# Patient Record
Sex: Female | Born: 1975 | Race: White | Hispanic: No | Marital: Married | Smoking: Never smoker
Health system: Southern US, Community
[De-identification: ages and names within clinical notes are randomized; demographics above are authoritative.]

## PROBLEM LIST (undated history)

## (undated) DIAGNOSIS — E039 Hypothyroidism, unspecified: Secondary | ICD-10-CM

## (undated) DIAGNOSIS — K08409 Partial loss of teeth, unspecified cause, unspecified class: Secondary | ICD-10-CM

## (undated) DIAGNOSIS — F419 Anxiety disorder, unspecified: Secondary | ICD-10-CM

## (undated) DIAGNOSIS — R002 Palpitations: Secondary | ICD-10-CM

## (undated) DIAGNOSIS — D72819 Decreased white blood cell count, unspecified: Secondary | ICD-10-CM

## (undated) DIAGNOSIS — D649 Anemia, unspecified: Secondary | ICD-10-CM

## (undated) DIAGNOSIS — D696 Thrombocytopenia, unspecified: Secondary | ICD-10-CM

## (undated) HISTORY — PX: WISDOM TOOTH EXTRACTION: SHX21

## (undated) HISTORY — DX: Palpitations: R00.2

## (undated) HISTORY — DX: Anxiety disorder, unspecified: F41.9

## (undated) HISTORY — DX: Thrombocytopenia, unspecified: D69.6

## (undated) HISTORY — DX: Decreased white blood cell count, unspecified: D72.819

---

## 2000-03-24 ENCOUNTER — Other Ambulatory Visit: Admission: RE | Admit: 2000-03-24 | Discharge: 2000-03-24 | Payer: Self-pay | Admitting: Obstetrics and Gynecology

## 2000-04-15 ENCOUNTER — Encounter: Payer: Self-pay | Admitting: Obstetrics and Gynecology

## 2000-04-15 ENCOUNTER — Inpatient Hospital Stay (HOSPITAL_COMMUNITY): Admission: AD | Admit: 2000-04-15 | Discharge: 2000-04-15 | Payer: Self-pay | Admitting: Obstetrics and Gynecology

## 2000-08-04 ENCOUNTER — Ambulatory Visit (HOSPITAL_COMMUNITY): Admission: AD | Admit: 2000-08-04 | Discharge: 2000-08-04 | Payer: Self-pay | Admitting: Family Medicine

## 2000-10-24 ENCOUNTER — Inpatient Hospital Stay (HOSPITAL_COMMUNITY): Admission: AD | Admit: 2000-10-24 | Discharge: 2000-10-26 | Payer: Self-pay | Admitting: Obstetrics and Gynecology

## 2000-11-05 ENCOUNTER — Encounter (HOSPITAL_COMMUNITY): Admission: RE | Admit: 2000-11-05 | Discharge: 2000-12-05 | Payer: Self-pay | Admitting: Obstetrics and Gynecology

## 2000-12-09 ENCOUNTER — Other Ambulatory Visit: Admission: RE | Admit: 2000-12-09 | Discharge: 2000-12-09 | Payer: Self-pay | Admitting: Obstetrics and Gynecology

## 2002-01-03 ENCOUNTER — Other Ambulatory Visit: Admission: RE | Admit: 2002-01-03 | Discharge: 2002-01-03 | Payer: Self-pay | Admitting: Obstetrics and Gynecology

## 2003-03-08 ENCOUNTER — Other Ambulatory Visit: Admission: RE | Admit: 2003-03-08 | Discharge: 2003-03-08 | Payer: Self-pay | Admitting: Obstetrics and Gynecology

## 2003-07-25 ENCOUNTER — Encounter: Admission: RE | Admit: 2003-07-25 | Discharge: 2003-07-25 | Payer: Self-pay | Admitting: Internal Medicine

## 2004-03-12 ENCOUNTER — Other Ambulatory Visit: Admission: RE | Admit: 2004-03-12 | Discharge: 2004-03-12 | Payer: Self-pay | Admitting: Obstetrics and Gynecology

## 2005-08-03 ENCOUNTER — Other Ambulatory Visit: Admission: RE | Admit: 2005-08-03 | Discharge: 2005-08-03 | Payer: Self-pay | Admitting: Obstetrics and Gynecology

## 2007-05-17 ENCOUNTER — Ambulatory Visit (HOSPITAL_COMMUNITY): Admission: RE | Admit: 2007-05-17 | Discharge: 2007-05-17 | Payer: Self-pay | Admitting: Obstetrics and Gynecology

## 2007-08-16 ENCOUNTER — Inpatient Hospital Stay (HOSPITAL_COMMUNITY): Admission: AD | Admit: 2007-08-16 | Discharge: 2007-08-18 | Payer: Self-pay | Admitting: Obstetrics and Gynecology

## 2007-09-02 ENCOUNTER — Ambulatory Visit: Admission: RE | Admit: 2007-09-02 | Discharge: 2007-09-02 | Payer: Self-pay | Admitting: Obstetrics and Gynecology

## 2009-12-05 DIAGNOSIS — F411 Generalized anxiety disorder: Secondary | ICD-10-CM | POA: Insufficient documentation

## 2010-01-09 ENCOUNTER — Ambulatory Visit: Payer: Self-pay | Admitting: Cardiology

## 2010-01-16 ENCOUNTER — Encounter: Admission: RE | Admit: 2010-01-16 | Discharge: 2010-01-16 | Payer: Self-pay | Admitting: Family Medicine

## 2010-01-20 ENCOUNTER — Ambulatory Visit (HOSPITAL_COMMUNITY): Admission: RE | Admit: 2010-01-20 | Discharge: 2010-01-20 | Payer: Self-pay | Admitting: Cardiology

## 2010-01-20 ENCOUNTER — Ambulatory Visit: Payer: Self-pay

## 2010-01-20 ENCOUNTER — Encounter: Payer: Self-pay | Admitting: Cardiology

## 2010-01-20 ENCOUNTER — Ambulatory Visit: Payer: Self-pay | Admitting: Internal Medicine

## 2010-03-12 ENCOUNTER — Ambulatory Visit: Payer: Self-pay | Admitting: Vascular Surgery

## 2010-03-12 ENCOUNTER — Encounter: Payer: Self-pay | Admitting: Family Medicine

## 2010-03-12 ENCOUNTER — Ambulatory Visit (HOSPITAL_COMMUNITY): Admission: RE | Admit: 2010-03-12 | Discharge: 2010-03-12 | Payer: Self-pay | Admitting: Family Medicine

## 2010-07-01 NOTE — Assessment & Plan Note (Signed)
Summary: NP6/IRREGULAR HEART BEAT/JML   Visit Type:  new pt visit Referring Provider:  Candice Camp Primary Provider:  Candice Camp  CC:  pt here for irrgeular heart beat sometimes when they last a while this makes her tired....denies any cp or edema.  History of Present Illness: Erica Ramsey is a delightful 35 year old married white female who is referred today by Dr. Rana Snare for the evaluation of irregular heartbeats and palpitations since last fall.  She had some increased stress back in the fall of 2010. She stopped exercising. She would notice that her heart would speed up in the mornings particularly when she first got up. He now can happen other times as well.  She's been told by Dr. Cliffton Asters her primary care at that her thyroid is slightly low. She is follow blood work scheduled in several months. Other blood work has been normal including a potassium, and magnesium. He takes a magnesium supplement daily.  This summer, she was placed on Diflucan and had increased palpitations. She says she thought she was going to die. She denied any syncope at that time.  She denies any orthopnea, PND or edema. She does not use caffeine. She sometimes doesn't sleep well. She does not exercise at the present time. She's been told that much of this may just be anxiety. She has not had an echocardiogram.  Current Medications (verified): 1)  Metronidazole 0.75 % Crea (Metronidazole) .... Use As Directed 2)  Vitamin D3 1000 Unit Tabs (Cholecalciferol) .Marland Kitchen.. 1 Tab Once Daily 3)  Nuvaring 0.12-0.015 Mg/24hr Ring (Etonogestrel-Ethinyl Estradiol) .... Use As Directed For Bcp 4)  Iron 325 (65 Fe) Mg Tabs (Ferrous Sulfate) .Marland Kitchen.. 1 Tab Two Times A Day 5)  Vitamin D3 5000 Unit Caps (Cholecalciferol) .Marland Kitchen.. 1 Cap Once Daily 6)  Magnesium 300 Mg Caps (Magnesium) .Marland Kitchen.. 1 Cap Once Daily 7)  Adrenal Support .Marland KitchenMarland Kitchen. 1 Cap Once Daily  Allergies (verified): 1)  ! Doxycycline 2)  ! Erythromycin 3)  ! * Diflucan  Past  History:  Past Medical History: Last updated: 12/05/2009 anxiety  Family History: Last updated: 12/05/2009 Family History of Hypertension:   Social History: Last updated: 12/05/2009 Married   Review of Systems       negative other than history of present illness  Vital Signs:  Patient profile:   35 year old female Height:      64 inches Weight:      120 pounds BMI:     20.67 Pulse rate:   82 / minute Pulse rhythm:   regular BP sitting:   98 / 60  (left arm) Cuff size:   large  Vitals Entered By: Danielle Rankin, CMA (January 09, 2010 11:55 AM)  Physical Exam  General:  Well developed, well nourished, in no acute distress. Head:  normocephalic and atraumatic Eyes:  PERRLA/EOM intact; conjunctiva and lids normal. Mouth:  Teeth, gums and palate normal. Oral mucosa normal. Neck:  Neck supple, no JVD. No masses, thyromegaly or abnormal cervical nodes. Chest Wall:  no deformities or breast masses noted Lungs:  Clear bilaterally to auscultation and percussion. Heart:  PMI not displaced, normal S1-S2, regular rate and rhythm, no click or murmur, no gallop, no right ventricular lift. Carotid upstrokes equal bilaterally without bruit Abdomen:  Bowel sounds positive; abdomen soft and non-tender without masses, organomegaly, or hernias noted. No hepatosplenomegaly. Msk:  Back normal, normal gait. Muscle strength and tone normal. Pulses:  pulses normal in all 4 extremities Extremities:  No clubbing or cyanosis. Neurologic:  Alert and oriented x 3. Skin:  Intact without lesions or rashes. Psych:  Normal affect.   EKG  Procedure date:  01/09/2010  Findings:      normal sinus rhythm, RSR PROM in V1 and V2 with concavity of the ST segment.  Impression & Recommendations:  Problem # 1:  PALPITATIONS (ICD-785.1) Assessment Deteriorated I suspect these are benign. Her exam is normal, she has an RSR prime but otherwise her EKG is normal. We'll plan a 2-D echocardiogram to rule out  any occult underlying structural heart disease. She's been told to stay away from Diflucan or other azols with her history of increased palpitations and possible QT prolongation. She will follow up with her primary care concerning her thyroid status. If her echocardiogram is normal I advised her to exercise at least 3 hours a week for stress reduction. Orders: EKG w/ Interpretation (93000) Echocardiogram (Echo)  Problem # 2:  ANXIETY (ICD-300.00)  Orders: EKG w/ Interpretation (93000) Echocardiogram (Echo)  Patient Instructions: 1)  Your physician recommends that you schedule a follow-up appointment in: as needed 2)  Your physician recommends that you continue on your current medications as directed. Please refer to the Current Medication list given to you today. 3)  Your physician has requested that you have an echocardiogram.  Echocardiography is a painless test that uses sound waves to create images of your heart. It provides your doctor with information about the size and shape of your heart and how well your heart's chambers and valves are working.  This procedure takes approximately one hour. There are no restrictions for this procedure. 4)  Your physician discussed the importance of regular exercise and recommended that you start or continue a regular exercise program for good health. Exercise 3 hours per week to improve vagal tone 5)  DO NOT TAKE ANY ANTIFUNGALS WITH AZOLE  6)  Follow up with your pcp regarding your thyroid

## 2010-12-16 ENCOUNTER — Encounter: Payer: Self-pay | Admitting: Cardiology

## 2010-12-17 ENCOUNTER — Ambulatory Visit: Payer: Self-pay | Admitting: Physician Assistant

## 2011-01-02 ENCOUNTER — Ambulatory Visit: Payer: Self-pay | Admitting: Cardiology

## 2011-02-23 LAB — CBC
HCT: 33.2 — ABNORMAL LOW
HCT: 40.8
Hemoglobin: 11.6 — ABNORMAL LOW
Hemoglobin: 11.6 — ABNORMAL LOW
MCHC: 34.8
MCHC: 35
MCV: 91.4
MCV: 92.7
RBC: 3.63 — ABNORMAL LOW
RBC: 4.47
RDW: 14.2
RDW: 14.4
WBC: 6

## 2011-02-23 LAB — RPR: RPR Ser Ql: NONREACTIVE

## 2011-03-06 LAB — RH IMMUNE GLOBULIN WORKUP (NOT WOMEN'S HOSP)

## 2011-03-06 LAB — GLUCOSE TOLERANCE, 1 HOUR: Glucose, 1 Hour GTT: 121

## 2011-04-08 ENCOUNTER — Inpatient Hospital Stay (HOSPITAL_COMMUNITY)
Admission: AD | Admit: 2011-04-08 | Discharge: 2011-04-08 | Disposition: A | Discharge status: Home / Self Care | Payer: BC Managed Care – PPO | Source: Ambulatory Visit | Attending: Obstetrics and Gynecology | Admitting: Obstetrics and Gynecology

## 2011-04-08 DIAGNOSIS — Z2989 Encounter for other specified prophylactic measures: Secondary | ICD-10-CM | POA: Insufficient documentation

## 2011-04-08 DIAGNOSIS — Z298 Encounter for other specified prophylactic measures: Secondary | ICD-10-CM | POA: Insufficient documentation

## 2011-04-08 LAB — RPR: RPR: NONREACTIVE

## 2011-04-08 MED ORDER — RHO D IMMUNE GLOBULIN 1500 UNIT/2ML IJ SOLN
300.0000 ug | Freq: Once | INTRAMUSCULAR | Status: AC
  Administered 2011-04-08: 300 ug via INTRAMUSCULAR
  Filled 2011-04-08: qty 2

## 2011-04-08 NOTE — Progress Notes (Signed)
Here for blood work and injection only.  Pt has received previously.  [facility] 06/23/11.  Rh factor handout given.  Time frame associated with blood draw and injection explained.

## 2011-04-09 LAB — RH IG WORKUP (INCLUDES ABO/RH)
ABO/RH(D): A NEG
Antibody Screen: NEGATIVE
Gestational Age(Wks): 29

## 2011-06-02 NOTE — L&D Delivery Note (Signed)
Delivery Note   At 4:43 AM a viable female was delivered via Vaginal, Spontaneous Delivery (Presentation: Left Occiput Anterior).  APGAR: 9, 9; weight 8 lb 15.6 oz (4070 g).   Placenta status: Intact, Spontaneous.  Cord: 3 vessels with the following complications: None.   Anesthesia: lidocaine 1%  Episiotomy: None Lacerations: 1st degree;Perineal Suture Repair: 3.0 vicryl rapide Est. Blood Loss (mL): 300  Mom to postpartum.  Baby to nursery-stable. Skin- skin Plans to BF Plans vasectomy for contraception  Via Rosado M 06/23/2011, 5:48 AM

## 2011-06-23 ENCOUNTER — Inpatient Hospital Stay (HOSPITAL_COMMUNITY)
Admission: AD | Admit: 2011-06-23 | Discharge: 2011-06-24 | DRG: 372 | Disposition: A | Discharge status: Home / Self Care | Payer: BC Managed Care – PPO | Source: Ambulatory Visit | Attending: Obstetrics and Gynecology | Admitting: Obstetrics and Gynecology

## 2011-06-23 ENCOUNTER — Encounter (HOSPITAL_COMMUNITY): Payer: Self-pay

## 2011-06-23 DIAGNOSIS — E079 Disorder of thyroid, unspecified: Secondary | ICD-10-CM | POA: Diagnosis present

## 2011-06-23 DIAGNOSIS — Z6791 Unspecified blood type, Rh negative: Secondary | ICD-10-CM | POA: Diagnosis present

## 2011-06-23 DIAGNOSIS — O9913 Other diseases of the blood and blood-forming organs and certain disorders involving the immune mechanism complicating the puerperium: Secondary | ICD-10-CM | POA: Diagnosis not present

## 2011-06-23 DIAGNOSIS — D696 Thrombocytopenia, unspecified: Secondary | ICD-10-CM | POA: Diagnosis present

## 2011-06-23 DIAGNOSIS — O09529 Supervision of elderly multigravida, unspecified trimester: Secondary | ICD-10-CM | POA: Diagnosis present

## 2011-06-23 DIAGNOSIS — E039 Hypothyroidism, unspecified: Secondary | ICD-10-CM | POA: Diagnosis present

## 2011-06-23 DIAGNOSIS — D689 Coagulation defect, unspecified: Secondary | ICD-10-CM | POA: Diagnosis not present

## 2011-06-23 HISTORY — DX: Anemia, unspecified: D64.9

## 2011-06-23 HISTORY — DX: Hypothyroidism, unspecified: E03.9

## 2011-06-23 LAB — RUBELLA ANTIBODY, IGM: Rubella: IMMUNE

## 2011-06-23 LAB — CBC
HCT: 39.9 % (ref 36.0–46.0)
MCV: 91.5 fL (ref 78.0–100.0)
RDW: 14.3 % (ref 11.5–15.5)
WBC: 6.3 10*3/uL (ref 4.0–10.5)

## 2011-06-23 LAB — RH IG WORKUP (INCLUDES ABO/RH)
Fetal Screen: NEGATIVE
Unit division: 0

## 2011-06-23 LAB — GC/CHLAMYDIA PROBE AMP, GENITAL

## 2011-06-23 LAB — HEPATITIS B SURFACE ANTIGEN: Hepatitis B Surface Ag: NEGATIVE

## 2011-06-23 MED ORDER — SIMETHICONE 80 MG PO CHEW: 80.0000 mg | CHEWABLE_TABLET | ORAL | Status: DC | PRN

## 2011-06-23 MED ORDER — OXYTOCIN BOLUS FROM INFUSION
500.0000 mL | Freq: Once | INTRAVENOUS | Status: AC
  Administered 2011-06-23: 500 mL via INTRAVENOUS
  Filled 2011-06-23: qty 500
  Filled 2011-06-23: qty 1000

## 2011-06-23 MED ORDER — CITRIC ACID-SODIUM CITRATE 334-500 MG/5ML PO SOLN: 30.0000 mL | ORAL | Status: DC | PRN

## 2011-06-23 MED ORDER — LANOLIN HYDROUS EX OINT: TOPICAL_OINTMENT | CUTANEOUS | Status: DC | PRN

## 2011-06-23 MED ORDER — BENZOCAINE-MENTHOL 20-0.5 % EX AERO: 1.0000 "application " | INHALATION_SPRAY | CUTANEOUS | Status: DC | PRN

## 2011-06-23 MED ORDER — IBUPROFEN 600 MG PO TABS: 600.0000 mg | ORAL_TABLET | Freq: Four times a day (QID) | ORAL | Status: DC | PRN

## 2011-06-23 MED ORDER — OXYTOCIN 10 UNIT/ML IJ SOLN: 10.0000 [IU] | Freq: Once | INTRAMUSCULAR | Status: DC

## 2011-06-23 MED ORDER — LIDOCAINE HCL (PF) 1 % IJ SOLN
30.0000 mL | INTRAMUSCULAR | Status: DC | PRN
  Administered 2011-06-23: 30 mL via SUBCUTANEOUS
  Filled 2011-06-23: qty 30

## 2011-06-23 MED ORDER — ACETAMINOPHEN 325 MG PO TABS: 650.0000 mg | ORAL_TABLET | ORAL | Status: DC | PRN

## 2011-06-23 MED ORDER — DIBUCAINE 1 % RE OINT: 1.0000 "application " | TOPICAL_OINTMENT | RECTAL | Status: DC | PRN

## 2011-06-23 MED ORDER — THYROID 30 MG PO TABS
32.5000 mg | ORAL_TABLET | Freq: Every day | ORAL | Status: DC
  Filled 2011-06-23: qty 1

## 2011-06-23 MED ORDER — PRENATAL MULTIVITAMIN CH
1.0000 | ORAL_TABLET | Freq: Every day | ORAL | Status: DC
  Filled 2011-06-23 (×2): qty 1

## 2011-06-23 MED ORDER — ONDANSETRON HCL 4 MG/2ML IJ SOLN: 4.0000 mg | Freq: Four times a day (QID) | INTRAMUSCULAR | Status: DC | PRN

## 2011-06-23 MED ORDER — MEASLES, MUMPS & RUBELLA VAC ~~LOC~~ INJ
0.5000 mL | INJECTION | Freq: Once | SUBCUTANEOUS | Status: DC
  Filled 2011-06-23: qty 0.5

## 2011-06-23 MED ORDER — THYROID 30 MG PO TABS
32.5000 mg | ORAL_TABLET | ORAL | Status: DC
Start: 2011-06-23 — End: 2011-06-23

## 2011-06-23 MED ORDER — LACTATED RINGERS IV SOLN
INTRAVENOUS | Status: DC
  Administered 2011-06-23: 02:00:00 via INTRAVENOUS

## 2011-06-23 MED ORDER — OXYCODONE-ACETAMINOPHEN 5-325 MG PO TABS: 2.0000 | ORAL_TABLET | ORAL | Status: DC | PRN

## 2011-06-23 MED ORDER — ONDANSETRON HCL 4 MG/2ML IJ SOLN: 4.0000 mg | INTRAMUSCULAR | Status: DC | PRN

## 2011-06-23 MED ORDER — DIPHENHYDRAMINE HCL 25 MG PO CAPS: 25.0000 mg | ORAL_CAPSULE | Freq: Four times a day (QID) | ORAL | Status: DC | PRN

## 2011-06-23 MED ORDER — THYROID 30 MG PO TABS
32.5000 mg | ORAL_TABLET | ORAL | Status: DC
  Administered 2011-06-23: 32.5 mg via ORAL

## 2011-06-23 MED ORDER — BUTORPHANOL TARTRATE 2 MG/ML IJ SOLN
2.0000 mg | INTRAMUSCULAR | Status: DC | PRN
  Administered 2011-06-23: 1 mg via INTRAVENOUS
  Filled 2011-06-23: qty 1

## 2011-06-23 MED ORDER — OXYCODONE-ACETAMINOPHEN 5-325 MG PO TABS
1.0000 | ORAL_TABLET | ORAL | Status: DC | PRN
  Administered 2011-06-24: 1 via ORAL

## 2011-06-23 MED ORDER — LACTATED RINGERS IV SOLN: 500.0000 mL | INTRAVENOUS | Status: DC | PRN

## 2011-06-23 MED ORDER — OXYTOCIN 20 UNITS IN LACTATED RINGERS INFUSION - SIMPLE: 125.0000 mL/h | Freq: Once | INTRAVENOUS | Status: DC

## 2011-06-23 MED ORDER — ONDANSETRON HCL 4 MG PO TABS: 4.0000 mg | ORAL_TABLET | ORAL | Status: DC | PRN

## 2011-06-23 MED ORDER — ZOLPIDEM TARTRATE 5 MG PO TABS: 5.0000 mg | ORAL_TABLET | Freq: Every evening | ORAL | Status: DC | PRN

## 2011-06-23 MED ORDER — THYROID 32.5 MG PO TABS
32.5000 mg | ORAL_TABLET | ORAL | Status: DC
  Administered 2011-06-24: 32.5 mg via ORAL

## 2011-06-23 MED ORDER — TETANUS-DIPHTH-ACELL PERTUSSIS 5-2.5-18.5 LF-MCG/0.5 IM SUSP: 0.5000 mL | Freq: Once | INTRAMUSCULAR | Status: DC

## 2011-06-23 MED ORDER — IBUPROFEN 600 MG PO TABS
600.0000 mg | ORAL_TABLET | Freq: Four times a day (QID) | ORAL | Status: DC
  Administered 2011-06-24: 600 mg via ORAL
  Filled 2011-06-23: qty 2
  Filled 2011-06-23 (×2): qty 1

## 2011-06-23 MED ORDER — SENNOSIDES-DOCUSATE SODIUM 8.6-50 MG PO TABS
2.0000 | ORAL_TABLET | Freq: Every day | ORAL | Status: DC
  Administered 2011-06-23: 2 via ORAL

## 2011-06-23 MED ORDER — WITCH HAZEL-GLYCERIN EX PADS: 1.0000 "application " | MEDICATED_PAD | CUTANEOUS | Status: DC | PRN

## 2011-06-23 NOTE — Progress Notes (Signed)
Pt presents with contractions q 2 minutes. G4P3, uncomfortable. Call to send pt to L/D

## 2011-06-23 NOTE — H&P (Signed)
Erica Ramsey is a 36 y.o. female presenting for ctx. Denies VB or LOF, +FM. PT woke up with ctx about 0100. Hx rapid labor.  Pregnancy significant for: 1. AMA 2. RH neg 3. Arrythmia 4. Hypothyroidism 5. Hx macrosomia with birth trauma 6. Hx rapid labor 7. Hx poly 8. Hx abruption at delivery - first preg 9. Low WBC  HPI: Pt began prenatal care at 8wks. Dating [place] at that time was C/W dates. Normal Anat scan at 19wks. C/O heart palpitations at 23wks with normal thyroid labs, declined cardiac referral and palpitations resolved. Received rhogam in 3rd trimester, and repeat thyroid panel was normal.Growth [place] at 33wks and 37wks were WNL.  Maternal Medical History:  Reason for admission: Reason for admission: contractions.  Contractions: Onset was 1-2 hours ago.   Frequency: regular.   Perceived severity is moderate.    Fetal activity: Perceived fetal activity is normal.   Last perceived fetal movement was within the past hour.    Prenatal complications: no prenatal complications   OB History    Grav Para Term Preterm Abortions TAB SAB Ect Mult Living   4 4 4       4      7-96 SVD female, term 7#6oz, abruption at delivery 5-02 SVD female term 8#13oz, no comps 3-09 SVD female term 10# compound presentation with birth trauma   Past Medical History  Diagnosis Date  . Anxiety   . Hypothyroidism   . Anemia    Past Surgical History  Procedure Date  . Wisdom tooth extraction    Family History: family history includes Hypertension in her other. Social History:  reports that she has never smoked. She has never used smokeless tobacco. She reports that she does not drink alcohol or use illicit drugs.  Pt is a MWF, works FT   Review of Systems  All other systems reviewed and are negative.    Dilation: 10 Effacement (%): 100 Station: +2 Exam by:: s. Yuniel Blaney, cnm Blood pressure 110/46, pulse 84, temperature 98 F (36.7 C), temperature source Oral, resp. rate 20, height 5\' 4"   (1.626 m), weight 73.483 kg (162 lb), last menstrual period 09/16/2010, SpO2 96.00%, unknown if currently breastfeeding. Maternal Exam:  Uterine Assessment: Contraction strength is firm.  Contraction duration is 60 seconds. Contraction frequency is regular.   Abdomen: Patient reports no abdominal tenderness. Estimated fetal weight is 8-5.   Fetal presentation: vertex  Introitus: Normal vulva. Normal vagina.  Vagina is negative for discharge.  Pelvis: adequate for delivery.   Cervix: Cervix evaluated by digital exam.     Fetal Exam Fetal Monitor Review: Mode: ultrasound.   Baseline rate: 130.  Variability: moderate (6-25 bpm).   Pattern: accelerations present and no decelerations.    Fetal State Assessment: Category I - tracings are normal.     Physical Exam  Nursing note and vitals reviewed. Constitutional: She is oriented to person, place, and time. She appears well-developed and well-nourished.  HENT:  Head: Normocephalic.  Neck: Normal range of motion.  Cardiovascular: Normal rate and normal heart sounds.   Respiratory: Effort normal and breath sounds normal.  GI: Soft.  Genitourinary: Vagina normal. No vaginal discharge found.  Musculoskeletal: Normal range of motion.  Neurological: She is alert and oriented to person, place, and time.  Skin: Skin is warm and dry.  Psychiatric: She has a normal mood and affect. Her behavior is normal.    Prenatal labs: ABO, Rh: --/--/A NEG (11/07 0955) Antibody: NEG (11/07 0955) Rubella: Immune (  01/22 0000) RPR: Nonreactive (11/07 0000)  HBsAg: Negative (01/22 0000)  HIV: Non-reactive (01/22 0000)  GBS: Negative (01/22 0000)    Assessment/Plan: IUP at 40w Active labor GBS neg FHR reassuring  Admit to birthing suites - Dr Su Hilt attending, CNM care Routine CNM orders Anesthesia/analgesia PRN    My Madariaga M 06/23/2011, 7:01 AM

## 2011-06-23 NOTE — Plan of Care (Signed)
Problem: Consults Goal: Birthing Suites Patient Information Press F2 to bring up selections list  Outcome: Completed/Met Date Met:  06/23/11  Pt 37-[redacted] weeks EGA

## 2011-06-23 NOTE — Progress Notes (Signed)
Gevena Barre, CNM at bedside. Assessing pt. Reviewed FHR tracing. Ok if check FHR intermittently.

## 2011-06-24 DIAGNOSIS — D696 Thrombocytopenia, unspecified: Secondary | ICD-10-CM | POA: Diagnosis present

## 2011-06-24 LAB — CBC
HCT: 34.4 % — ABNORMAL LOW (ref 36.0–46.0)
MCH: 31.1 pg (ref 26.0–34.0)
MCHC: 33.7 g/dL (ref 30.0–36.0)
MCV: 92.2 fL (ref 78.0–100.0)
Platelets: 114 10*3/uL — ABNORMAL LOW (ref 150–400)
RDW: 14.4 % (ref 11.5–15.5)
WBC: 6.6 10*3/uL (ref 4.0–10.5)

## 2011-06-24 MED ORDER — IBUPROFEN 600 MG PO TABS: 600.0000 mg | ORAL_TABLET | Freq: Four times a day (QID) | ORAL | Status: AC | PRN

## 2011-06-24 MED ORDER — RHO D IMMUNE GLOBULIN 1500 UNIT/2ML IJ SOLN
300.0000 ug | Freq: Once | INTRAMUSCULAR | Status: AC
  Administered 2011-06-24: 300 ug via INTRAMUSCULAR
  Filled 2011-06-24: qty 2

## 2011-06-24 NOTE — Progress Notes (Signed)
Post Partum Day 1 Subjective: Pt desiring early d/c today.  No c/o's except some issues w/ latch and sore nipples.  Difficulties w/ previous child and had to pump for 8 months and give breastmilk via bottle.  Up ad lib.  VB lighter today.  tol po and voiding well.  Only taken 1 Motrin tablet since delivery.  Delcines contraception.    Objective: Blood pressure 110/73, pulse 87, temperature 97.5 F (36.4 C), temperature source Oral, resp. rate 18, height 5\' 4"  (1.626 m), weight 73.483 kg (162 lb), last menstrual period 09/16/2010, SpO2 96.00%, unknown if currently breastfeeding.  Physical Exam:  General: alert, cooperative and no distress Lochia: appropriate, rubra Uterine Fundus: firm, u/-2 Incision: n/a DVT Evaluation: No evidence of DVT seen on physical exam. Negative Homan's sign. No significant calf/ankle edema.   Basename 06/24/11 0537 06/23/11 0215  HGB 11.6* 13.9  HCT 34.4* 39.9    Assessment/Plan: Discharge home and Breastfeeding Thrombocytopenia--will check CBC at PP visit in 6 weeks. Hypothyroidism--thyroid panel at 6 weeks or prn (if pt's NP recommends before PP exam)--continue Armour po. Rh Neg--Rhophylac before d/c today. H/o arrhythmias in past--no current c/o's. F/u 6 weeks at [facility] or prn.  Motrin/Tylenol prn pain, and continue PNV.  Colace prn.  LOS: 1 day   Huyen Perazzo H 06/24/2011, 10:39 AM

## 2011-06-24 NOTE — Discharge Summary (Signed)
Obstetric Discharge Summary Reason for Admission: onset of labor Prenatal Procedures: ultrasound Intrapartum Procedures: spontaneous vaginal delivery Postpartum Procedures: Rho(D) Ig Complications-Operative and Postpartum: 1st  degree perineal laceration Hemoglobin  Date Value Range Status  06/24/2011 11.6* 12.0-15.0 (g/dL) Final     HCT  Date Value Range Status  06/24/2011 34.4* 36.0-46.0 (%) Final  .. Results for orders placed during the hospital encounter of 06/23/11 (from the past 48 hour(s))  HIV ANTIBODY (ROUTINE TESTING)     Status: Normal      Component Value Range Comment   HIV Non-reactive     GC/CHLAMYDIA PROBE AMP, GENITAL     Status: Normal      Component Value Range Comment   Gonorrhea Declined      Chlamydia Declined     STREP B DNA PROBE     Status: Normal      Component Value Range Comment   Group B Strep Ag Negative     RUBELLA ANTIBODY, IGM     Status: Normal      Component Value Range Comment   Rubella Immune     HEPATITIS B SURFACE ANTIGEN     Status: Normal      Component Value Range Comment   Hepatitis B Surface Ag Negative     CBC     Status: Abnormal   Collection Time   06/23/11  2:15 AM      Component Value Range Comment   WBC 6.3  4.0 - 10.5 (K/uL)    RBC 4.36  3.87 - 5.11 (MIL/uL)    Hemoglobin 13.9  12.0 - 15.0 (g/dL)    HCT 16.1  09.6 - 04.5 (%)    MCV 91.5  78.0 - 100.0 (fL)    MCH 31.9  26.0 - 34.0 (pg)    MCHC 34.8  30.0 - 36.0 (g/dL)    RDW 40.9  81.1 - 91.4 (%)    Platelets 132 (*) 150 - 400 (K/uL)   RPR     Status: Normal   Collection Time   06/23/11  2:15 AM      Component Value Range Comment   RPR NON REACTIVE  NON REACTIVE    CBC     Status: Abnormal   Collection Time   06/24/11  5:37 AM      Component Value Range Comment   WBC 6.6  4.0 - 10.5 (K/uL)    RBC 3.73 (*) 3.87 - 5.11 (MIL/uL)    Hemoglobin 11.6 (*) 12.0 - 15.0 (g/dL)    HCT 78.2 (*) 95.6 - 46.0 (%)    MCV 92.2  78.0 - 100.0 (fL)    MCH 31.1  26.0 - 34.0 (pg)    MCHC 33.7  30.0 - 36.0 (g/dL)    RDW 21.3  08.6 - 57.8 (%)    Platelets 114 (*) 150 - 400 (K/uL)   RH IG WORKUP, [facility]     Status: Normal (Preliminary result)   Collection Time   06/24/11  5:37 AM      Component Value Range Comment   Gestational Age(Wks) 40      ABO/RH(D) A NEG      Fetal Screen NEG      Unit Number [id number]      Blood Component Type RHIG      Unit division 00      Status of Unit ALLOCATED      Transfusion Status OK TO TRANSFUSE      Hospital Course: Pt presented on  day of admission in active labor w/ onset of ctxs around 0100.  Pt's labor progressed spontaneously to SVD.  PP course unremarkable w/ exception of thrombocytopenia persisting and difficult latch.  Pt and husband desire early discharge on PP day #1.  Planning vasectomy.  Minimal perineal pain.  VB stable.    Discharge Diagnoses: Term Pregnancy-delivered and Lactating; thrombocytopenia; Hypothyroidism; Rh Neg; AMA; h/o arrhythmia; h/o macrosomia w/ birth trauma in past; anxiety; h/o rapid labor  Discharge Information: Date: 06/24/2011 PPD #1 Activity: pelvic rest Diet: routine Medications: PNV, Ibuprofen, Colace and Armour thyroid supplement Condition: stable Instructions: refer to practice specific booklet Discharge to: home Follow-up Information    Follow up with [facility]. Schedule an appointment as soon as possible for a visit in 6 weeks. (or call as needed if questions or concerns)          Newborn Data: Live born female "Dahlia Client"  (delivery provider Sanda Klein, CNM) Birth Weight: 8 lb 15.6 oz (4070 g) APGAR: 9, 9  Home with mother.  Erica Ramsey H 06/24/2011, 10:44 AM

## 2011-07-10 ENCOUNTER — Ambulatory Visit (HOSPITAL_COMMUNITY)
Admission: RE | Admit: 2011-07-10 | Discharge: 2011-07-10 | Disposition: A | Discharge status: Home / Self Care | Payer: BC Managed Care – PPO | Source: Ambulatory Visit | Attending: Obstetrics and Gynecology | Admitting: Obstetrics and Gynecology

## 2011-07-10 NOTE — Progress Notes (Signed)
Infant Lactation Consultation Outpatient Visit Note  Patient Name: GLYNN FREAS Date of Birth: 28-Dec-1975 Birth Weight:  8+15 4054 Lowest wieght after birth was 8+5 Gestational Age at Delivery: Gestational Age: <None> 40 Type of Delivery: Vaginal  Breastfeeding History Frequency of Breastfeeding: 10 Length of Feeding: 30 Voids: 6 Stools: 6+  Supplementing / Method: Pumping:  Type of Pump:   Frequency:  Volume:    Comments:    Consultation Evaluation:  Initial Feeding Assessment: Pre-feed Weight:4108 Post-feed [id number]:0454 Amount Transferred:6 Comments:Here today for severe nipple pain.  Nipples are slightly pink and have cracks. Left nipple has broken skin in the center of the nipple and right nipple is cracked at the base laterally   Showed mother how to achieve a deeper latch and improve positioning.  She reports increased comfort but still has soreness on the left nipple  Additional Feeding Assessment: Pre-feed [id number]:1478 Post-feed [id number]:3086 Amount Transferred:50 Comments:  Additional Feeding Assessment: Pre-feed VHQION:6295 Post-feed [id number]:4401 Amount Transferred:8 Comments: Left breast makes considerably less milk than right.  Reveals that she is on thyroid medicine and that prescriber will check thyroid at 6 weeks  Total Breast milk Transferred this Visit: 64 Total Supplement Given:   Additional Interventions:  Pump for 10 minutes after feeding to help increase Milk supply Start all purpose nipple cream as prescribed. Call pediatrician about white spots on upper gum. Attend BF support group on Tuesday to check baby's weight.  Make appointment if Dahlia Client does not increase weight by  4-5 ounces. Watch for any decrease in milk supply and count diapers   Follow-Up      Soyla Dryer 07/10/2011, 1:08 PM

## 2011-07-30 ENCOUNTER — Ambulatory Visit (INDEPENDENT_AMBULATORY_CARE_PROVIDER_SITE_OTHER): Payer: BC Managed Care – PPO

## 2011-08-05 ENCOUNTER — Ambulatory Visit (HOSPITAL_COMMUNITY)
Admission: RE | Admit: 2011-08-05 | Discharge: 2011-08-05 | Disposition: A | Discharge status: Home / Self Care | Payer: BC Managed Care – PPO | Source: Ambulatory Visit

## 2011-08-05 NOTE — Progress Notes (Addendum)
Adult Lactation Consultation Outpatient Visit Note  Patient Name: TENNILLE MONTELONGO     And Baby girl Lexine Baton  Date of Birth: 05/08/1976 Gestational Age at Delivery: Unknown Type of Delivery:  Per mom consult today due to difficult latch , soreness , and being tx for a yeast infection for the 2nd time - ( started 2nd round of Diflucan after seeing Midwife Skeet Simmer at [place] . Mom also mentioned after the 1st tx the yeast seemed to be improving and then it came back . Mom  Seemed tired ( also noted dark circles around her eyes. Encouraged mom to ask family for assistance .  Breastfeeding History: Frequency of Breastfeeding: per mom Every 2 1/2 -3 hours  Length of Feeding: some 15-20 mins ( per mom depending how sore I am )   Voids >6  Stools > 2-3 yellowish brown  Supplementing / Method: Pumping:  Type of Pump:Medela ( expressed milk )    Frequency: per mom? 1-2 x's per day   Volume:    Comments:    Consultation Evaluation:  Initial Feeding Assessment: Pre-feed Weight:10.9oz( 4790g) ( per mom up form 10.1 oz ) at the DR. Office  Post-feed Weight:10.10.5oz [phone])  Amount Transferred:25ml  Comments:Infant was able to sustain latch ,worked on the depth due to soreness ( at 1st mom felt some intermittent soreness in nipple and breast ) . Infant fed for 15 - . And ended feeding . Some pinching on both sides of the nipple noted . Infant has a tendency to come away from the mom and not keep the depth. ( per mom mentioned she had been using Dr. Irving Burton nipples and [facility] was wondering if infant narrows sucking pattern due to the bottle nipple and pacifier . )   Additional Feeding Assessment: Pre-feed Weight:10.10.5oz ( 4832g)  Post-feed Weight:10.12.7oz ( 4896g)  Amount Transferred:80ml  Comments:Infant was able to sustain a deeper latch and mom seemed more comfortable . Fed for 20 mins   Additional Feeding Assessment: Pre-feed Weight: Post-feed Weight: Amount  Transferred: Comments:  Total Breast milk Transferred this Visit:  Total Supplement Given: none   Additional Interventions: Recommended to mom purchasing a boppy pillow for firmer support , work on alternating breast positions to empty breast better . Avoid engorgement and if feeding a bottle make sure shes pumps for 15 mins to protect milk supply . Continue the yeast tx prescribed by the [facility] . , Obtain prenatals ( per mom hadn't taken them for 3 weeks . Calls [facility] physicain for a refill on nystatin for "Thrush " ( stiil noted with LC assessment ) . Check calories for being adequate for breastfeeding , also lots flds. When latching [facility] work on the depth . Also follow the "Patient instructions for Thrush tx. From the [facility] office .    Follow-Up - Encouraged mom to follow up with [facility] office if yeast is resolving ,also the midwife and the infants physician .       Kathrin Greathouse 08/05/2011, 9:56 AM

## 2011-08-10 ENCOUNTER — Telehealth: Payer: Self-pay | Admitting: Oncology

## 2011-08-10 NOTE — Telephone Encounter (Signed)
called pt and scheduled appt for 03/22. will fax a letter to Dr. Redmond Baseman office with appt d.t

## 2011-08-12 ENCOUNTER — Telehealth: Payer: Self-pay | Admitting: Oncology

## 2011-08-12 NOTE — Telephone Encounter (Signed)
Referred by Dr. Normand Sloop Dx- Dec WBC/Plts

## 2011-08-13 ENCOUNTER — Encounter: Payer: Self-pay | Admitting: Oncology

## 2011-08-13 DIAGNOSIS — D72819 Decreased white blood cell count, unspecified: Secondary | ICD-10-CM | POA: Insufficient documentation

## 2011-08-21 ENCOUNTER — Telehealth: Payer: Self-pay | Admitting: Oncology

## 2011-08-21 ENCOUNTER — Other Ambulatory Visit (HOSPITAL_BASED_OUTPATIENT_CLINIC_OR_DEPARTMENT_OTHER): Payer: BC Managed Care – PPO | Admitting: Lab

## 2011-08-21 ENCOUNTER — Ambulatory Visit (HOSPITAL_BASED_OUTPATIENT_CLINIC_OR_DEPARTMENT_OTHER): Payer: BC Managed Care – PPO | Admitting: Oncology

## 2011-08-21 ENCOUNTER — Ambulatory Visit: Payer: BC Managed Care – PPO

## 2011-08-21 ENCOUNTER — Encounter: Payer: Self-pay | Admitting: Oncology

## 2011-08-21 ENCOUNTER — Other Ambulatory Visit: Payer: Self-pay | Admitting: Oncology

## 2011-08-21 VITALS — BP 112/57 | HR 101 | Temp 97.1°F | Ht 64.0 in | Wt 126.5 lb

## 2011-08-21 DIAGNOSIS — D72819 Decreased white blood cell count, unspecified: Secondary | ICD-10-CM

## 2011-08-21 DIAGNOSIS — D696 Thrombocytopenia, unspecified: Secondary | ICD-10-CM

## 2011-08-21 LAB — CBC WITH DIFFERENTIAL/PLATELET
EOS%: 1.3 % (ref 0.0–7.0)
Eosinophils Absolute: 0 10*3/uL (ref 0.0–0.5)
MCV: 85.4 fL (ref 79.5–101.0)
MONO%: 6.7 % (ref 0.0–14.0)
NEUT#: 1.2 10*3/uL — ABNORMAL LOW (ref 1.5–6.5)
RBC: 5.06 10*6/uL (ref 3.70–5.45)
RDW: 13.2 % (ref 11.2–14.5)
lymph#: 0.9 10*3/uL (ref 0.9–3.3)

## 2011-08-21 LAB — COMPREHENSIVE METABOLIC PANEL
BUN: 17 mg/dL (ref 6–23)
CO2: 30 mEq/L (ref 19–32)
Calcium: 9.9 mg/dL (ref 8.4–10.5)
Chloride: 104 mEq/L (ref 96–112)
Creatinine, Ser: 0.82 mg/dL (ref 0.50–1.10)

## 2011-08-21 LAB — CHCC SMEAR

## 2011-08-21 LAB — MORPHOLOGY: PLT EST: ADEQUATE

## 2011-08-21 NOTE — Patient Instructions (Signed)
1.  Low platelet count:  Recovered.  Your plt is normal today. 2.  Low white blood cell count (leukopenia):  Your blood smear appeared benign today.  No suggestion of leukemia. This could be low because of low thyroid function. In the future, if your count continues to worsen, then we may consider a bone marrow biopsy.   - Will monitor your blood count in 1, 3, and 7 months. - follow up with Belenda Cruise (Dr. Lodema Pilot nurse practitioner) in about 5 months.  - follow up with Dr. Gaylyn Rong in about 10  Months.    Thank you!

## 2011-08-21 NOTE — Progress Notes (Signed)
Patient came in today as a new patient,she has BCBS,I explain to her about our financial assistance program,but she said right now she was oh [place].

## 2011-08-21 NOTE — Progress Notes (Signed)
[facility] INITIAL HEMATOLOGY CONSULTATION  Referral MD:  Erica Ramsey, M.D. PCP's:  Erica Ramsey, M.D.;  Erica Ramsey, N.P.  Reason for Referral:  Leukopenia, neutropenia.     HPI:  Mrs. Erica Ramsey is a 36 year-old Caucasian woman with history of hypothyroidism.  She has been seeing her PCP's.  Routine CBC were obtained which showed leukkopenia.  The oldest CBC in our system dated back on 08/16/2007 which showed WBC 6; Hgb 14.4; Plt 137.  This was when she delivered her 3rd baby.  From outside record, on 11/18/2009, WBC 3.4; Hgb 14; Plt 161; ANC 2.0.  On 03/06/2010, WBC 4.9; ANC 3.9; Hgb 15.6; Plt 176.  On 06/20/2010, WBC 3.5; ANC 2.3; Hgb 14.6; Plt 153.  On 09/15/2010; WBC 2.4; ANC 1.2; Hgb 13.7; Plt 142.  On 12/16/2010; WBC 4.2; Hgb 13.6; Plt 149.  On 07/30/2011, WBC 2.8; ANC not obtained; Hgb 15.3; plt 142.  Given leukocytopenia and thrombocytopenia, she was kindly [facility] by Dr. Normand Ramsey.   Mrs. Well presented to the clinic by herself today.  She just delivered a healthy baby girl 2 months ago.  She has mild fatigue as expected.  However, she is independent of activities of daily living taking care of 4 children and works full time.   Patient denies headache, visual changes, confusion, drenching night sweats, palpable lymph node swelling, mucositis, odynophagia, dysphagia, nausea vomiting, jaundice, chest pain, palpitation, shortness of breath, dyspnea on exertion, productive cough, gum bleeding, epistaxis, hematemesis, hemoptysis, abdominal pain, abdominal swelling, early satiety, melena, hematochezia, hematuria, skin rash, spontaneous bleeding, joint swelling, joint pain, heat or cold intolerance, bowel bladder incontinence, back pain, focal motor weakness, paresthesia, depression, suicidal or homocidal ideation, feeling hopelessness.     Past Medical History  Diagnosis Date  . Anxiety   . Hypothyroidism   . Anemia   . Thrombocytopenia   .  Leukocytopenia   . Palpitation     resolved  :    Past Surgical History  Procedure Date  . Wisdom tooth extraction   :   CURRENT MEDS: Current Outpatient Prescriptions  Medication Sig Dispense Refill  . FENUGREEK PO Take 3 capsules by mouth 3 (three) times daily.      . Prenatal Vit-Fe Fumarate-FA (PRENATAL MULTIVITAMIN) TABS Take 1 tablet by mouth daily.      Marland Kitchen thyroid (ARMOUR) 32.5 MG tablet Take 32.5 mg by mouth daily. Patient states that this drug is an [employer] OTC natural supplement known as Nature-throid.          Allergies  Allergen Reactions  . Doxycycline     REACTION: nausea  . Erythromycin     REACTION: nausea  :  Family History  Problem Relation Age of Onset  . Hypertension Other   . Cancer Maternal Grandmother     Head/neck cancer  :  History   Social History  . Marital Status: Married    Spouse Name: N/A    Number of Children: 4  . 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 Smoking status: Never Smoker   . Smokeless tobacco: Never Used  . Alcohol Use: No  . Drug Use: No  . Sexually Active: Yes    Birth Control/ Protection: None   Other Topics Concern  . Not on file   Social History Narrative  . No narrative on file  :  REVIEW OF SYSTEM:  The rest of the 14-point review of sytem was negative.   Exam:  General:  well-nourished in no acute distress.  Eyes:  no scleral icterus.  ENT:  There were no oropharyngeal lesions.  Neck was without thyromegaly.  Lymphatics:  Negative cervical, supraclavicular or axillary adenopathy.  Respiratory: lungs were clear bilaterally without wheezing or crackles.  Cardiovascular:  Regular rate and rhythm, S1/S2, without murmur, rub or gallop.  There was no pedal edema.  GI:  abdomen was soft, flat, nontender, nondistended, without organomegaly.  Muscoloskeletal:  no spinal tenderness of palpation of vertebral spine.  Skin exam was  without echymosis, petichae.  Neuro exam was nonfocal.  Patient was able to get on and off exam table without assistance.  Gait was normal.  Patient was alerted and oriented.  Attention was good.   Language was appropriate.  Mood was normal without depression.  Speech was not pressured.  Thought content was not tangential.    LABS:  Lab Results  Component Value Date   WBC 2.3* 08/21/2011   HGB 15.1 08/21/2011   HCT 43.2 08/21/2011   PLT 205 Platelet count consistent in citrate 08/21/2011   GLUCOSE 93 08/21/2011   ALT 45* 08/21/2011   AST 28 08/21/2011   NA 142 08/21/2011   K 4.3 08/21/2011   CL 104 08/21/2011   CREATININE 0.82 08/21/2011   BUN 17 08/21/2011   CO2 30 08/21/2011     Blood smear review:   I personally reviewed the patient's peripheral blood smear today.  There was isocytosis.  There was no peripheral blast.  There was no schistocytosis, spherocytosis, target cell, rouleaux formation, tear drop cell.  There was no giant platelets or platelet clumps.      ASSESSMENT AND PLAN:   1.  Hypothyroidism:  She is on Armour 32.5mg  PO daily per PCP.  Her last TSH was normal at 1.407 uIU/mL on 07/30/2011.   She does not have clinical signs and symptoms of hypothyroidism.   2.  Thrombocytopenia:  Unclear etiology.  This has now resolved.  Ddx:  Transient ITP which has spontaneously resolved vs platelet clump from outside testing.  She did have past evidence of gestational thrombocytopenia.   3.  Leukocytopenia:  Differential diagnosis:  Intermittent asymptomatic cyclic neutropenia (with no history of recurrent infection) vs autoimmune mediated vs hypothyroidism.  I have low clinical suspicion for leukemic process since this has been on going for at least 2 years.  She has balanced diet making unlikely to be VitB12/folate deficiency.  She was tested negative for HIV and HepBsAg on 06/23/2011.  Work up:  I sent for VitB12/folate level just to rule these deficiency as cause of her leukocytopenia.   Diagnostic bone marrow biopsy at this time has very low clinical yield as her ANC is still >1 and she does not have pancytopenia, and she is asymptomatic with this for about 2 years.  Recommendation: - F/U with CBC at [facility] in 1, and 3 months.   She has appointment with my NP in about 5 months, and I'll see her again in about 10 months.  I'll recommend further work up in the future if she is symptomatic or worsened neutropenia or pancytopenia.   Patient expressed informed understanding and wished to proceed with stated recommendations.

## 2011-08-21 NOTE — Telephone Encounter (Signed)
gv pt appts for april-jan2014

## 2011-08-22 LAB — FOLATE: Folate: >20 ng/mL

## 2011-08-22 LAB — VITAMIN B12: Vitamin B-12: 598 pg/mL (ref 211–911)

## 2011-09-17 ENCOUNTER — Ambulatory Visit: Payer: BC Managed Care – PPO | Admitting: Obstetrics and Gynecology

## 2011-09-18 ENCOUNTER — Telehealth: Payer: Self-pay

## 2011-09-18 ENCOUNTER — Other Ambulatory Visit (HOSPITAL_BASED_OUTPATIENT_CLINIC_OR_DEPARTMENT_OTHER): Payer: BC Managed Care – PPO | Admitting: Lab

## 2011-09-18 DIAGNOSIS — D696 Thrombocytopenia, unspecified: Secondary | ICD-10-CM

## 2011-09-18 DIAGNOSIS — D72819 Decreased white blood cell count, unspecified: Secondary | ICD-10-CM

## 2011-09-18 LAB — CBC WITH DIFFERENTIAL/PLATELET
Eosinophils Absolute: 0 10*3/uL (ref 0.0–0.5)
MONO#: 0.3 10*3/uL (ref 0.1–0.9)
MONO%: 8.8 % (ref 0.0–14.0)
NEUT#: 2.2 10*3/uL (ref 1.5–6.5)
RBC: 4.57 10*6/uL (ref 3.70–5.45)
RDW: 15 % — ABNORMAL HIGH (ref 11.2–14.5)
WBC: 3.5 10*3/uL — ABNORMAL LOW (ref 3.9–10.3)
lymph#: 0.9 10*3/uL (ref 0.9–3.3)
nRBC: 0 % (ref 0–0)

## 2011-09-18 NOTE — Telephone Encounter (Signed)
Message copied by Kallie Locks on Fri Sep 18, 2011  4:36 PM ------      Message from: Jethro Bolus T      Created: Fri Sep 18, 2011  2:43 PM       Please call pt.  Her WBC has improved.  Continue observation.  Thanks.

## 2011-10-07 ENCOUNTER — Ambulatory Visit (INDEPENDENT_AMBULATORY_CARE_PROVIDER_SITE_OTHER): Payer: BC Managed Care – PPO

## 2011-10-07 VITALS — BP 90/62 | Resp 16 | Ht 64.0 in | Wt 120.0 lb

## 2011-10-07 DIAGNOSIS — Z01419 Encounter for gynecological examination (general) (routine) without abnormal findings: Secondary | ICD-10-CM

## 2011-10-07 NOTE — Progress Notes (Signed)
Subjective:    Erica Ramsey is a 36 y.o. female, P4004, who presents for an annual exam. She reports symptoms of increased anxiety, heart flutters, and concerned her "thyroid" may be off, so her NP drew labs yesterday; results pending.  Has been seeing Hematologist r/e thrombocytopenia and leukocytopenia; she reports levels have now been trending back up and has appt w/ them in June.  She is concerned that taking Diflucan for sometime for breast yeast may have contributed to problem; declines Liver Panel today.  She hasn't taken Diflucan in "weeks" she reports.  Husband has not scheduled vasectomy yet, but planning to proceed this summer.  Satisfied w/ condoms (IC only 2 times since delivery end of Jan).  Milk supply is down recently; reports more output from Rt than left; took Fenugreek in past and is considering restarting.  No other c/o's. Pt thinks she may not be eating and drinking enough w/ busy lifestyle to sustain better milk supply.      History   Social History  . Marital Status: Married    Spouse Name: N/A    Number of Children: 4  . 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 Smoking status: Never Smoker   . Smokeless tobacco: Never Used  . Alcohol Use: No  . Drug Use: No  . Sexually Active: Yes -- Female partner(s)    Birth Control/ Protection: None   Other Topics Concern  . None   Social History Narrative  . None    Menstrual cycle:   LMP: Patient's last menstrual period was 09/12/2011.           Cycle: Pt 1st cycle since delivery last month and normal and Regular, monthly with normal flow and no severe dysmenorrha  The following portions of the patient's history were reviewed and updated as appropriate: allergies, current medications, past family history, past medical history, past social history, past surgical history and problem list.  Review of Systems Pertinent items are noted in  HPI. Breast:Negative for breast lump,nipple discharge or nipple retraction Gastrointestinal: Negative for abdominal pain, change in bowel habits or rectal bleeding Urinary:negative   Objective:    BP 90/62  Resp 16  Ht 5\' 4"  (1.626 m)  Wt 120 lb (54.432 kg)  BMI 20.60 kg/m2  LMP 09/12/2011  Breastfeeding? Yes    Weight:  Wt Readings from Last 1 Encounters:  10/07/11 120 lb (54.432 kg)          BMI: Body mass index is 20.60 kg/(m^2).  General Appearance: Alert, appropriate appearance for age. No acute distress HEENT: Grossly normal Neck / Thyroid: Supple, no masses, nodes or enlargement Lungs: clear to auscultation bilaterally Back: No CVA tenderness Breast Exam: No masses or nodes.No dimpling, nipple retraction or discharge. Cardiovascular: Regular rate and rhythm. S1, S2, no murmur Gastrointestinal: Soft, non-tender, no masses or organomegaly Pelvic Exam: Vulva and vagina appear normal. Bimanual exam reveals normal uterus and adnexa. cx shifted to Pt's right; weak Kegel: 2/5. Rectovaginal: not indicated Lymphatic Exam: Non-palpable nodes in neck, clavicular, axillary, or inguinal regions Skin: no rash or abnormalities Neurologic: Normal gait and speech, no tremor  Psychiatric: Alert and oriented, appropriate affect.   Wet Prep:not applicable Urinalysis:not applicable UPT: Not done   Assessment:    Normal gyn exam   Lactating (s/p delivery Jan 2012) Recent anxiety and palpitations--w/u by NP yesterday and awaiting labs Thrombocytopenia and leukocytopenia--followed by Hematology  Plan:     pap smear return annually or prn STD screening: declined Contraception:condoms  Continue f/u w/ PCP and Hematology r/e above thyroid dysfunction and abnl blood counts. Rec'd healthy lifestyle, daily kegels and sunscreen.    Takya Vandivier H CNM

## 2011-10-07 NOTE — Progress Notes (Signed)
Last Pap: 10/2009 WNL: Yes Regular Periods:yes Contraception: none   Monthly Breast exam:no Tetanus<68yrs: Unsure Nl.Bladder Function:yes Daily BMs:yes Healthy Diet:yes Calcium:yes Mammogram:no Exercise:no Seatbelt: yes Abuse at home: no Stressful work:no Sigmoid-colonoscopy: n/a Bone Density: No  Pt states she think thyroid levels are "out of whack." Had blood test yesterday.
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
## 2011-10-09 LAB — PAP IG W/ RFLX HPV ASCU

## 2011-11-13 ENCOUNTER — Ambulatory Visit (HOSPITAL_BASED_OUTPATIENT_CLINIC_OR_DEPARTMENT_OTHER): Payer: BC Managed Care – PPO

## 2011-11-13 DIAGNOSIS — D72819 Decreased white blood cell count, unspecified: Secondary | ICD-10-CM

## 2011-11-13 DIAGNOSIS — D696 Thrombocytopenia, unspecified: Secondary | ICD-10-CM

## 2011-11-13 LAB — CBC WITH DIFFERENTIAL/PLATELET
BASO%: 0.5 % (ref 0.0–2.0)
EOS%: 1.7 % (ref 0.0–7.0)
HCT: 42.3 % (ref 34.8–46.6)
MCH: 30.8 pg (ref 25.1–34.0)
MCHC: 34.2 g/dL (ref 31.5–36.0)
NEUT%: 51.5 % (ref 38.4–76.8)
lymph#: 1.3 10*3/uL (ref 0.9–3.3)

## 2011-11-16 ENCOUNTER — Telehealth: Payer: Self-pay | Admitting: *Deleted

## 2011-11-16 NOTE — Telephone Encounter (Signed)
Left VM on pt's cell phone requesting she call us back for lab results.  Also called home number and got answering machine but did not leave VM at home number.

## 2011-11-16 NOTE — Telephone Encounter (Signed)
Message copied by Wende Mott on Mon Nov 16, 2011  2:22 PM ------      Message from: HA, Raliegh Ip T      Created: Mon Nov 16, 2011 12:16 PM       Please call pt.  Her WBC is still low; but ANC normal.  I again recommend watchful observation.  Thanks.

## 2011-11-20 ENCOUNTER — Other Ambulatory Visit: Payer: BC Managed Care – PPO | Admitting: Lab

## 2012-01-20 ENCOUNTER — Telehealth: Payer: Self-pay | Admitting: Oncology

## 2012-01-20 NOTE — Telephone Encounter (Signed)
pt called to r/s 8/22 appt to 9/12    aom

## 2012-01-21 ENCOUNTER — Other Ambulatory Visit: Payer: BC Managed Care – PPO | Admitting: Lab

## 2012-01-21 ENCOUNTER — Ambulatory Visit: Payer: BC Managed Care – PPO | Admitting: Oncology

## 2012-02-11 ENCOUNTER — Telehealth: Payer: Self-pay | Admitting: Oncology

## 2012-02-11 ENCOUNTER — Ambulatory Visit (HOSPITAL_BASED_OUTPATIENT_CLINIC_OR_DEPARTMENT_OTHER): Payer: BC Managed Care – PPO | Admitting: Oncology

## 2012-02-11 ENCOUNTER — Encounter: Payer: Self-pay | Admitting: Oncology

## 2012-02-11 ENCOUNTER — Other Ambulatory Visit (HOSPITAL_BASED_OUTPATIENT_CLINIC_OR_DEPARTMENT_OTHER): Payer: BC Managed Care – PPO | Admitting: Lab

## 2012-02-11 VITALS — BP 105/66 | HR 75 | Temp 97.9°F | Resp 20 | Ht 64.0 in | Wt 106.5 lb

## 2012-02-11 DIAGNOSIS — D696 Thrombocytopenia, unspecified: Secondary | ICD-10-CM

## 2012-02-11 DIAGNOSIS — E039 Hypothyroidism, unspecified: Secondary | ICD-10-CM

## 2012-02-11 DIAGNOSIS — D72819 Decreased white blood cell count, unspecified: Secondary | ICD-10-CM

## 2012-02-11 LAB — COMPREHENSIVE METABOLIC PANEL (CC13)
Alkaline Phosphatase: 62 U/L (ref 40–150)
BUN: 17 mg/dL (ref 7.0–26.0)
Creatinine: 0.9 mg/dL (ref 0.6–1.1)
Glucose: 86 mg/dl (ref 70–99)
Total Bilirubin: 0.4 mg/dL (ref 0.20–1.20)

## 2012-02-11 LAB — CBC WITH DIFFERENTIAL/PLATELET
BASO%: 0.9 % (ref 0.0–2.0)
Basophils Absolute: 0 10*3/uL (ref 0.0–0.1)
EOS%: 2.4 % (ref 0.0–7.0)
HGB: 13.8 g/dL (ref 11.6–15.9)
MCH: 30.6 pg (ref 25.1–34.0)
MCHC: 33.8 g/dL (ref 31.5–36.0)
RBC: 4.51 10*6/uL (ref 3.70–5.45)
RDW: 12.9 % (ref 11.2–14.5)
lymph#: 1 10*3/uL (ref 0.9–3.3)

## 2012-02-11 LAB — CHCC SMEAR

## 2012-02-11 LAB — MORPHOLOGY

## 2012-02-11 NOTE — Progress Notes (Signed)
[facility]  Telephone:([phone] Fax:(336) [phone]   OFFICE PROGRESS NOTE   Cc:  Elie Goody, NP  DIAGNOSIS: Leuko-cytopenia  CURRENT THERAPY: Watchful observation  INTERVAL HISTORY: Erica Ramsey 36 y.o. female returns for routine followup by herself. Patient continues to have mild fatigue she has 4 children ranging in age from 7 months to 17 years. She continues to work full time is independent in activities of daily living. She has had no recurrent infections. Patient denies headache, visual changes, confusion, drenching night sweats, palpable lymph nodes, mucositis, nausea, vomiting, chest pain, palpitations, shortness of breath, dyspnea on exertion, productive cough, epistaxis, hematemesis, hemoptysis, abdominal pain, abdominal swelling, early satiety, melena, hematochezia, hematuria, skin rash, spontaneous bleeding, joint swelling, joint pain, bowel bladder incontinence, back pain, focal motor weakness, paresthesia, depression, suicidal or homicidal ideation, feeling hopelessness.  Past Medical History  Diagnosis Date  . Anxiety   . Hypothyroidism   . Anemia   . Thrombocytopenia   . Leukocytopenia   . Palpitation     resolved    Past Surgical History  Procedure Date  . Wisdom tooth extraction     Current Outpatient Prescriptions  Medication Sig Dispense Refill  . Probiotic Product (PROBIOTIC PO) Take by mouth daily.      Marland Kitchen thyroid (ARMOUR) 32.5 MG tablet Take 32.5 mg by mouth daily. Patient states that this drug is an [employer] OTC natural supplement known as Nature-throid.        ALLERGIES:  is allergic to doxycycline and erythromycin.  REVIEW OF SYSTEMS:  The rest of the 14-point review of system was negative.   Filed Vitals:   02/11/12 1442  BP: 105/66  Pulse: 75  Temp: 97.9 F (36.6 C)  Resp: 20   Wt Readings from Last 3 Encounters:  02/11/12 106 lb 8 oz (48.308 kg)  10/07/11 120 lb (54.432 kg)  08/21/11 126 lb 8 oz (57.38 kg)    ECOG Performance status: 0  PHYSICAL EXAMINATION:   General:  well-nourished in no acute distress.  Eyes:  no scleral icterus.  ENT:  There were no oropharyngeal lesions.  Neck was without thyromegaly.  Lymphatics:  Negative cervical, supraclavicular or axillary adenopathy.  Respiratory: lungs were clear bilaterally without wheezing or crackles.  Cardiovascular:  Regular rate and rhythm, S1/S2, without murmur, rub or gallop.  There was no pedal edema.  GI:  abdomen was soft, flat, nontender, nondistended, without organomegaly.  Muscoloskeletal:  no spinal tenderness of palpation of vertebral spine.  Skin exam was without echymosis, petichae.  Neuro exam was nonfocal.  Patient was able to get on and off exam table without assistance.  Gait was normal.  Patient was alerted and oriented.  Attention was good.   Language was appropriate.  Mood was normal without depression.  Speech was not pressured.  Thought content was not tangential.    LABORATORY/RADIOLOGY DATA:  Lab Results  Component Value Date   WBC 3.5* 02/11/2012   HGB 13.8 02/11/2012   HCT 40.9 02/11/2012   PLT 174 02/11/2012   GLUCOSE 86 02/11/2012   ALKPHOS 62 02/11/2012   ALT 21 02/11/2012   AST 16 02/11/2012   NA 138 02/11/2012   K 4.3 02/11/2012   CL 105 02/11/2012   CREATININE 0.9 02/11/2012   BUN 17.0 02/11/2012   CO2 25 02/11/2012    ASSESSMENT AND PLAN:   1. Hypothyroidism: She is on Armour 32.5 mg by mouth daily per PCP. She is not have clinical signs and symptoms of  hypothyroidism.  2. Thrombocytopenia: Unclear etiology. This is now resolved. Differential diagnosis includes transient ITP she spontaneous resolved versus platelet clumping outside testing. The patient has a history of gestational thrombocytopenia.  3. Leukocytopenia: Mrs. been going on for over 2 years. ANC remains normal. WBC is slightly low but stable. She has not had any recurrent infections. Recommend continued observation. She will have a CBC in approximately  2 months time and then a repeat visit with Dr Gaylyn Rong in late January early February 2013. Further workup in the future would be needed if she becomes symptomatic or has worsening of her neutropenia or pancytopenia.      The length of time of the face-to-face encounter was 15  minutes. More than 50% of time was spent counseling and coordination of care.

## 2012-02-11 NOTE — Telephone Encounter (Signed)
Gave pt appt for lab in January 2014 then see MD in February 2014 with labs

## 2012-03-22 ENCOUNTER — Other Ambulatory Visit: Payer: BC Managed Care – PPO

## 2012-04-19 ENCOUNTER — Other Ambulatory Visit: Payer: BC Managed Care – PPO | Admitting: Lab

## 2012-04-19 NOTE — Progress Notes (Signed)
Received lab results from [facility]; forwarded to Dr. Gaylyn Rong.

## 2012-04-20 ENCOUNTER — Telehealth: Payer: Self-pay | Admitting: Oncology

## 2012-04-20 ENCOUNTER — Other Ambulatory Visit: Payer: Self-pay | Admitting: Oncology

## 2012-04-20 NOTE — Telephone Encounter (Signed)
Per 11/19 pof cx 11/19 lb. Pt had lb drawn at pcp

## 2012-06-22 ENCOUNTER — Other Ambulatory Visit: Payer: BC Managed Care – PPO | Admitting: Lab

## 2012-06-22 ENCOUNTER — Ambulatory Visit: Payer: BC Managed Care – PPO | Admitting: Oncology

## 2012-07-07 ENCOUNTER — Telehealth: Payer: Self-pay | Admitting: Oncology

## 2012-07-07 ENCOUNTER — Ambulatory Visit: Payer: BC Managed Care – PPO | Admitting: Lab

## 2012-07-07 ENCOUNTER — Other Ambulatory Visit (HOSPITAL_BASED_OUTPATIENT_CLINIC_OR_DEPARTMENT_OTHER): Payer: BC Managed Care – PPO | Admitting: Lab

## 2012-07-07 ENCOUNTER — Ambulatory Visit (HOSPITAL_BASED_OUTPATIENT_CLINIC_OR_DEPARTMENT_OTHER): Payer: BC Managed Care – PPO | Admitting: Oncology

## 2012-07-07 VITALS — BP 112/64 | HR 83 | Temp 97.7°F | Resp 20 | Ht 64.0 in | Wt 103.9 lb

## 2012-07-07 DIAGNOSIS — D696 Thrombocytopenia, unspecified: Secondary | ICD-10-CM

## 2012-07-07 DIAGNOSIS — D72819 Decreased white blood cell count, unspecified: Secondary | ICD-10-CM

## 2012-07-07 DIAGNOSIS — E039 Hypothyroidism, unspecified: Secondary | ICD-10-CM

## 2012-07-07 LAB — CBC WITH DIFFERENTIAL/PLATELET
Basophils Absolute: 0 10*3/uL (ref 0.0–0.1)
Eosinophils Absolute: 0 10*3/uL (ref 0.0–0.5)
HGB: 14 g/dL (ref 11.6–15.9)
LYMPH%: 34 % (ref 14.0–49.7)
MCV: 87.6 fL (ref 79.5–101.0)
MONO#: 0.1 10*3/uL (ref 0.1–0.9)
MONO%: 5 % (ref 0.0–14.0)
NEUT#: 1.7 10*3/uL (ref 1.5–6.5)
Platelets: 163 10*3/uL (ref 145–400)
RDW: 12.4 % (ref 11.2–14.5)
WBC: 2.8 10*3/uL — ABNORMAL LOW (ref 3.9–10.3)

## 2012-07-07 LAB — COMPREHENSIVE METABOLIC PANEL (CC13)
ALT: 29 U/L (ref 0–55)
CO2: 27 mEq/L (ref 22–29)
Calcium: 9.2 mg/dL (ref 8.4–10.4)
Chloride: 104 mEq/L (ref 98–107)
Glucose: 124 mg/dl — ABNORMAL HIGH (ref 70–99)
Sodium: 141 mEq/L (ref 136–145)
Total Bilirubin: 0.38 mg/dL (ref 0.20–1.20)
Total Protein: 7.1 g/dL (ref 6.4–8.3)

## 2012-07-07 LAB — TSH: TSH: 1.922 u[IU]/mL (ref 0.350–4.500)

## 2012-07-07 NOTE — Progress Notes (Signed)
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   CURRENT THERAPY:  Watchful observation.   INTERVAL HISTORY: Erica Ramsey 37 y.o. female returns for regular follow up. She noticed that since taking iodine-containing fish oil, she has been having more fatigue.  She has increased appetite; but continue to lose weight.  She reports feeling cold.  She denied skin, hair, nail changes or diarrhea.  She denied fever, palpable node swelling, recurrent infection.   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  The rest of the 14-point review of system was negative.   Past Medical History  Diagnosis Date  . Anxiety   . Hypothyroidism   . Anemia   . Thrombocytopenia   . Leukocytopenia   . Palpitation     resolved    Past Surgical History  Procedure Date  . Wisdom tooth extraction     Current Outpatient Prescriptions  Medication Sig Dispense Refill  . Thyroid (NATURE-THROID) 16.25 MG TABS Take 2 tablets by mouth daily.        ALLERGIES:  is allergic to doxycycline and erythromycin.  REVIEW OF SYSTEMS:  The rest of the 14-point review of system was negative.   Filed Vitals:   07/07/12 1511  BP: 112/64  Pulse: 83  Temp: 97.7 F (36.5 C)  Resp: 20   Wt Readings from Last 3 Encounters:  07/07/12 103 lb 14.4 oz (47.129 kg)  02/11/12 106 lb 8 oz (48.308 kg)  10/07/11 120 lb (54.432 kg)   ECOG Performance status: 0-1  PHYSICAL EXAMINATION:   General:  well-nourished in no acute distress.  Eyes:  no scleral icterus.  ENT:  There were no oropharyngeal lesions.  Neck was without thyromegaly.  Lymphatics:  Negative cervical, supraclavicular or axillary adenopathy.  Respiratory: lungs were clear bilaterally without wheezing or crackles.  Cardiovascular:  Regular rate and rhythm, S1/S2, without murmur, rub or gallop.  There was no  pedal edema.  GI:  abdomen was soft, flat, nontender, nondistended, without organomegaly.  Muscoloskeletal:  no spinal tenderness of palpation of vertebral spine.  Skin exam was without echymosis, petichae.  Neuro exam was nonfocal.  Patient was able to get on and off exam table without assistance.  Gait was normal.  Patient was alerted and oriented.  Attention was good.   Language was appropriate.  Mood was normal without depression.  Speech was not pressured.  Thought content was not tangential.         LABORATORY/RADIOLOGY DATA:  Lab Results  Component Value Date   WBC 2.8* 07/07/2012   HGB 14.0 07/07/2012   HCT 40.8 07/07/2012   PLT 163 07/07/2012   GLUCOSE 124* 07/07/2012   ALKPHOS 65 07/07/2012   ALT 29 07/07/2012   AST 16 07/07/2012   NA 141 07/07/2012   K 3.6 07/07/2012   CL 104 07/07/2012   CREATININE 0.8 07/07/2012   BUN 19.6 07/07/2012   CO2 27 07/07/2012      ASSESSMENT AND PLAN:    1.  Low white blood cell (leukopenia) without neutropenia. - Potential cause:  Normal variation. My review of blood smear today showed no concerning sign of leukemia.  Extensive work up in the past was non revealling.  -  Recommendation:   *  Watchful observation with blood count every 3 months.   *  When neutrophil is <1.5, we may consider  diagnostic bone marrow biopsy.   She expressed informed understanding and was OK with watchful observation.  2.  Hypothyroid on replacement with mixed symptomatologies:  I sent for TSH today.   3.  Follow up:  Lab only appointment every 3 months.  Follow up in about 1 year.     The length of time of the face-to-face encounter was 15 minutes. More than 50% of time was spent counseling and coordination of care.

## 2012-07-07 NOTE — Patient Instructions (Addendum)
1.  Issue:  Low white blood cell (leukopenia) without neutropenia. 2.  Potential cause:  Normal variation. My review of blood smear today showed no concerning sign of leukemia.  Extensive work up in the past was non revealling.  3.  Recommendation:   *  Watchful observation with blood count every 3 months.   *  When neutrophil is <1.5, we may consider diagnostic bone marrow biopsy.

## 2012-07-07 NOTE — Telephone Encounter (Signed)
gv and printed appt schedule for pt for May, Aug, Nov and Feb 2015...sent pt back to the lab

## 2012-07-22 IMAGING — US US ABDOMEN COMPLETE
1 series · 14 of 25 positions shown · non-contrast
Comparison: Ultrasound abdomen of 07/25/2003

CLINICAL DATA: Right upper quadrant abdominal pain

COMPLETE ABDOMINAL ULTRASOUND

[Series 1: us abdomen complete · 0.33mm/px · 14 of 81 slices shown]
[im 1/81]
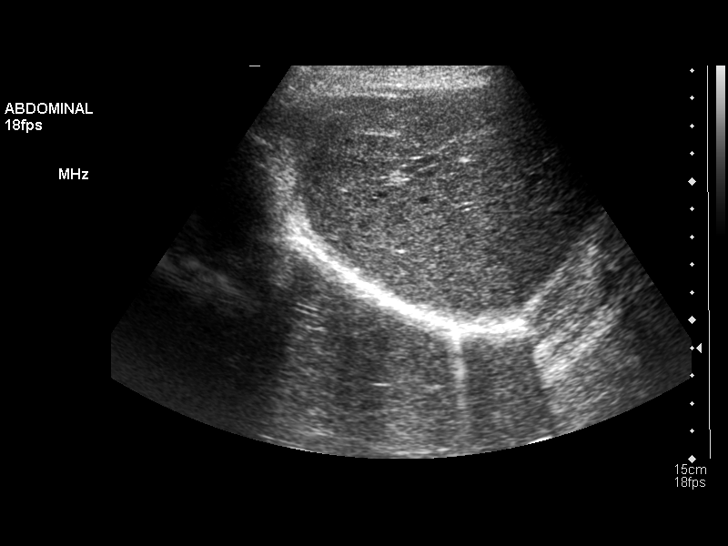
[im 7/81]
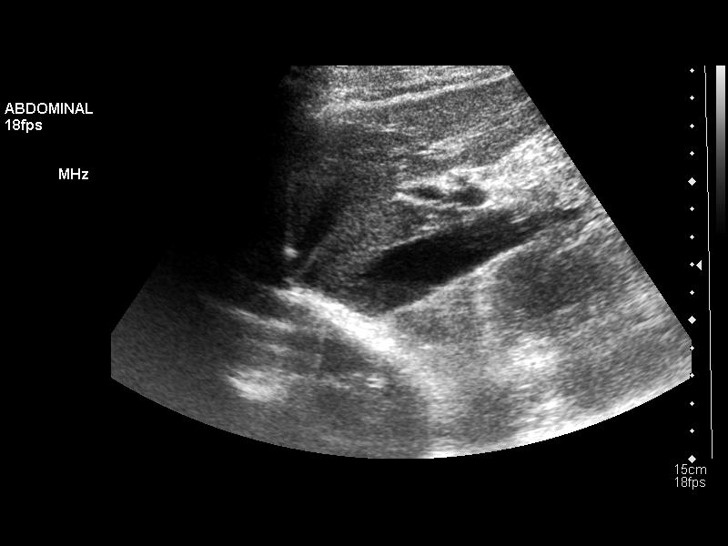
[im 14/81]
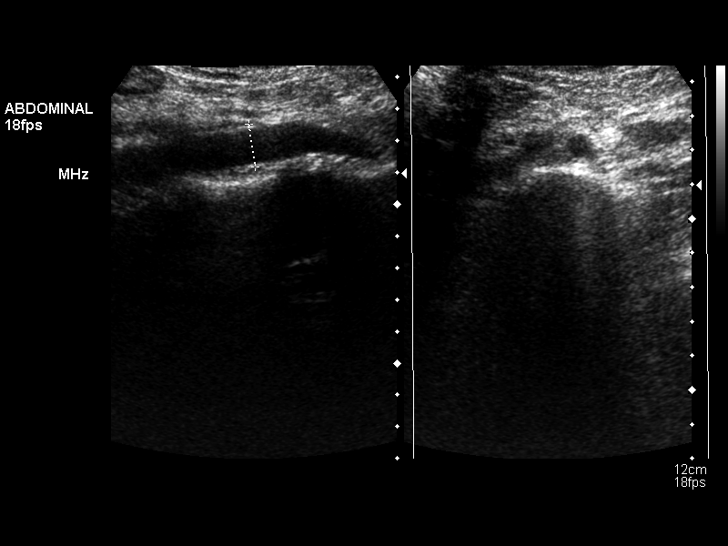
[im 21/81]
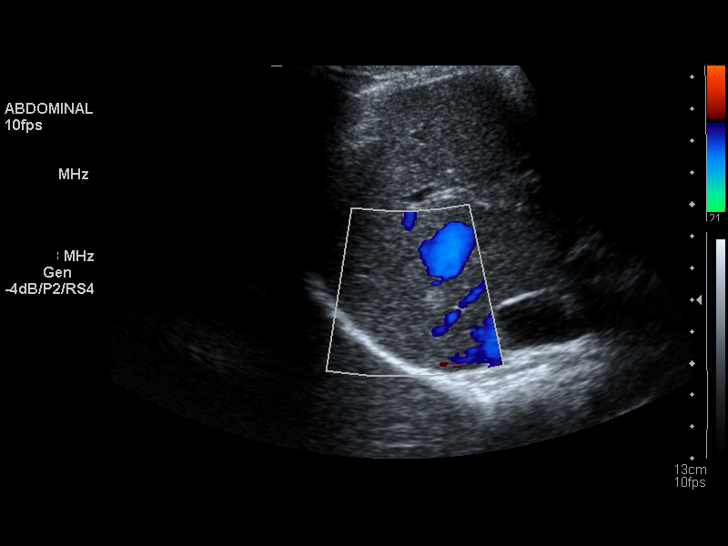
[im 27/81]
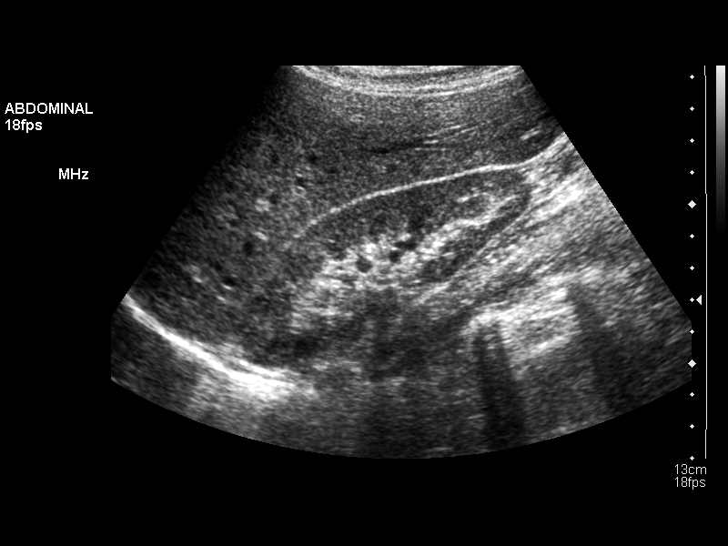
[im 31/81]
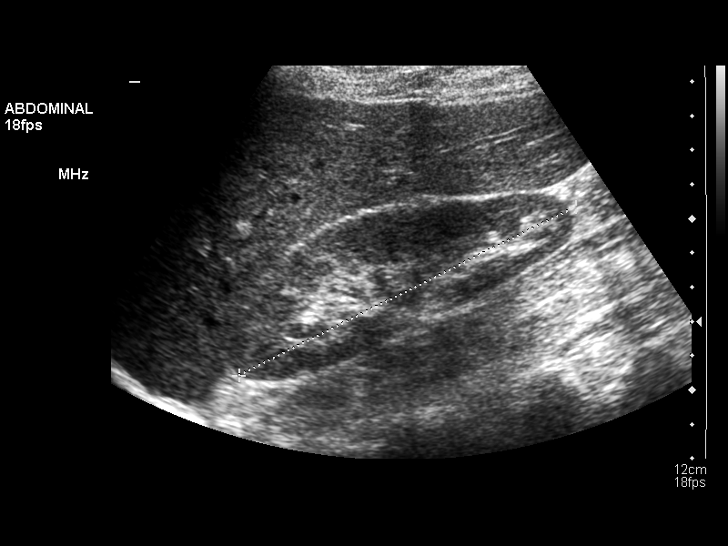
[im 37/81]
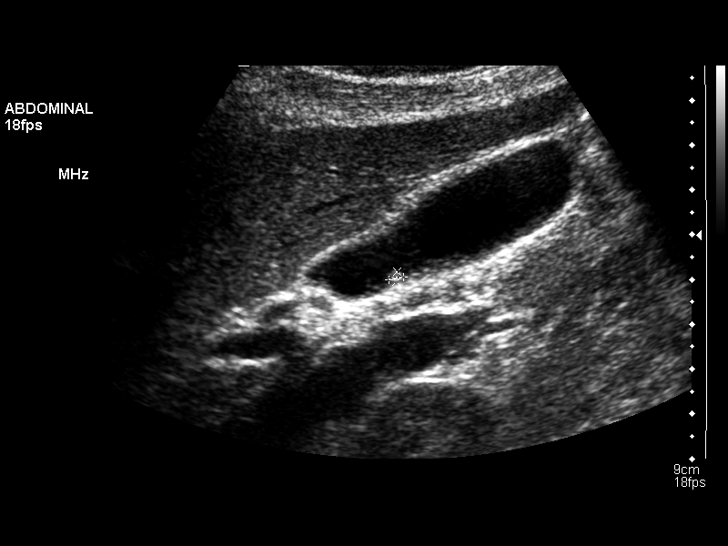
[im 44/81]
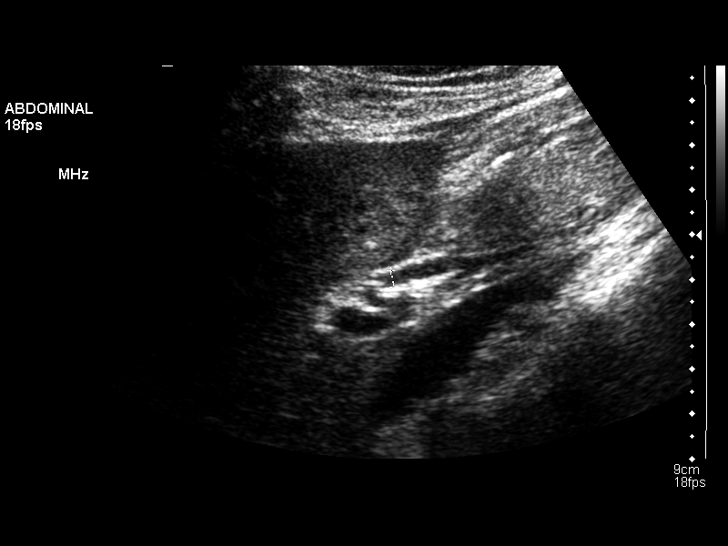
[im 51/81]
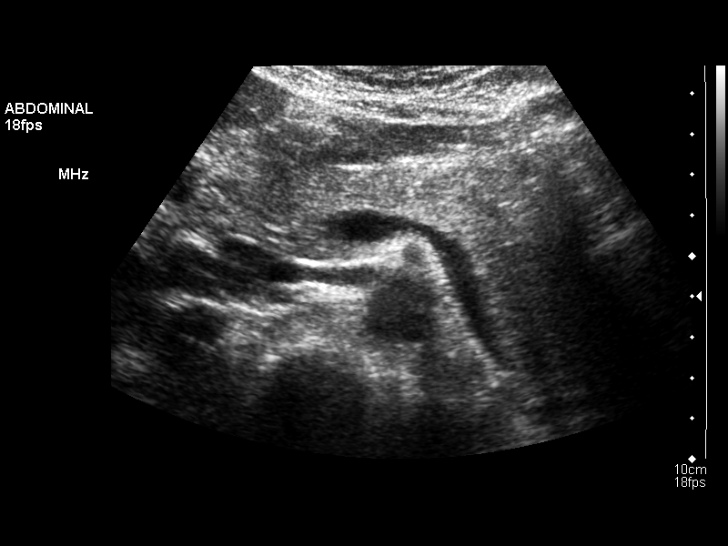
[im 54/81]
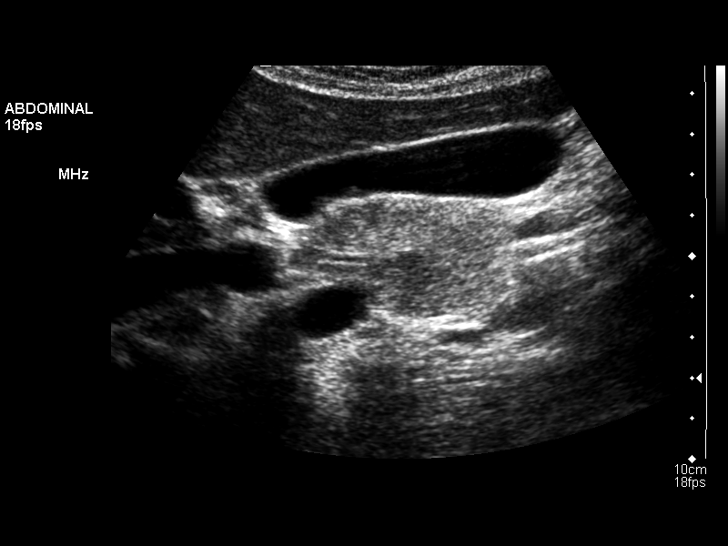
[im 61/81]
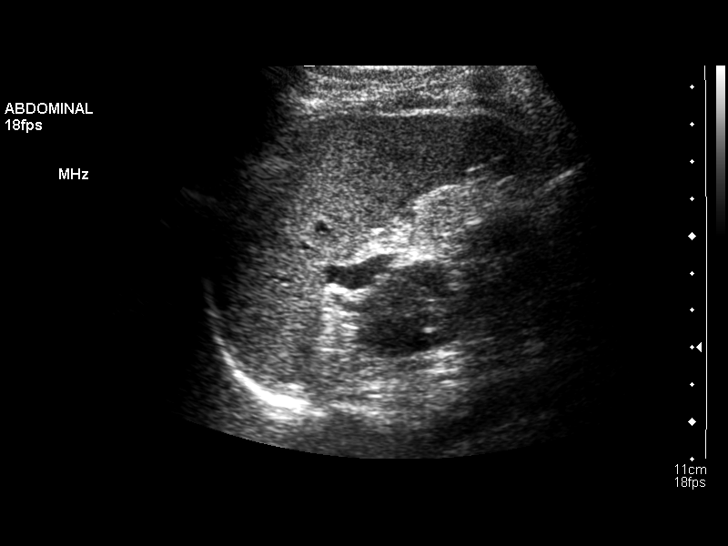
[im 67/81]
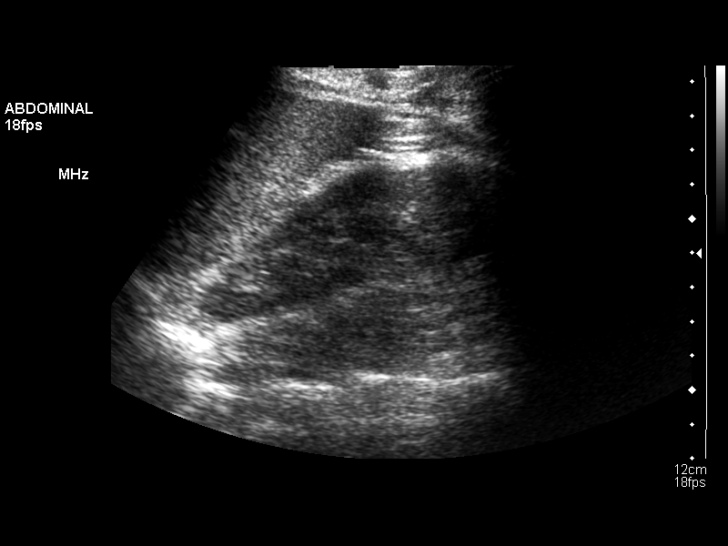
[im 74/81]
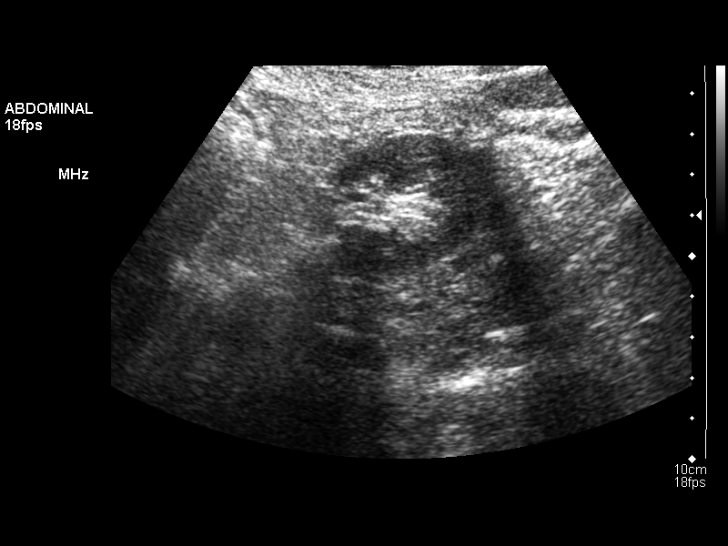
[im 81/81]
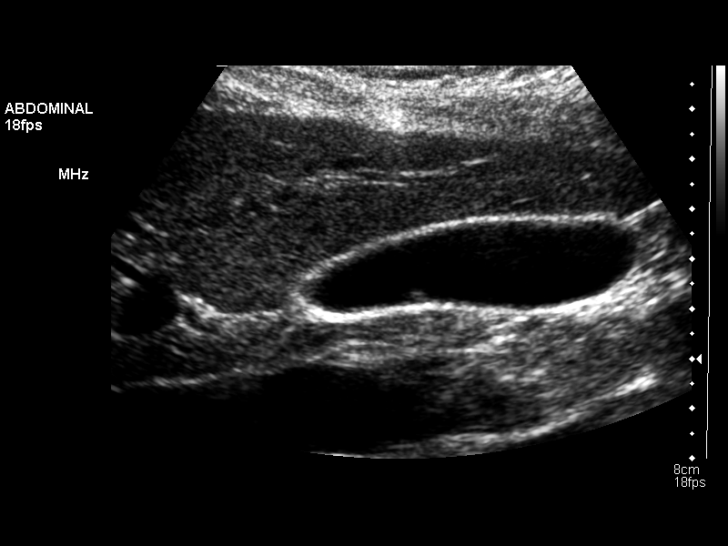

[14 of 25 positions shown; findings below may reference images not displayed]

FINDINGS: Gallbladder:  A 3 mm gallbladder polyp is present.  No gallstones
are seen.

Common bile duct:  The common bile duct is normal measuring 3.7 mm
in diameter.

Liver:  The liver is normal in echogenicity.  No ductal dilatation
is seen.  A single hyper echogenic focus in the right lobe is
consistent with a 7 mm hemangioma. This small echogenic focus is
not definitely seen on the prior ultrasound.

IVC:  Appears normal.

Pancreas:  No focal abnormality seen.

Spleen:  The spleen is normal measuring 6.9 cm sagittally.

Right Kidney:  No hydronephrosis is seen.  The right kidney
measures 11.0 cm sagittally.

Left Kidney:  No hydronephrosis.  The left kidney measures 11.4 cm.

Abdominal aorta:  The abdominal aorta is normal in caliber.
IMPRESSION: 1.  3 mm gallbladder polyp.  No gallstones.
2.  No ductal dilatation.
3.  7 mm hemangioma in the right lobe of liver.

## 2012-09-22 ENCOUNTER — Encounter (HOSPITAL_COMMUNITY)
Admission: RE | Admit: 2012-09-22 | Discharge: 2012-09-22 | Disposition: A | Discharge status: Home / Self Care | Payer: BC Managed Care – PPO | Source: Ambulatory Visit

## 2012-09-22 DIAGNOSIS — O923 Agalactia: Secondary | ICD-10-CM | POA: Insufficient documentation

## 2012-09-27 ENCOUNTER — Telehealth: Payer: Self-pay | Admitting: Oncology

## 2012-10-04 ENCOUNTER — Other Ambulatory Visit: Payer: BC Managed Care – PPO

## 2012-10-05 ENCOUNTER — Other Ambulatory Visit: Payer: Self-pay | Admitting: Oncology

## 2012-10-23 ENCOUNTER — Encounter (HOSPITAL_COMMUNITY)
Admission: RE | Admit: 2012-10-23 | Discharge: 2012-10-23 | Disposition: A | Discharge status: Home / Self Care | Payer: BC Managed Care – PPO | Source: Ambulatory Visit

## 2012-10-23 DIAGNOSIS — O923 Agalactia: Secondary | ICD-10-CM | POA: Insufficient documentation

## 2012-11-23 ENCOUNTER — Encounter (HOSPITAL_COMMUNITY)
Admission: RE | Admit: 2012-11-23 | Discharge: 2012-11-23 | Disposition: A | Discharge status: Home / Self Care | Payer: BC Managed Care – PPO | Source: Ambulatory Visit

## 2012-11-23 DIAGNOSIS — O923 Agalactia: Secondary | ICD-10-CM | POA: Insufficient documentation

## 2012-12-23 ENCOUNTER — Encounter (HOSPITAL_COMMUNITY)
Admission: RE | Admit: 2012-12-23 | Discharge: 2012-12-23 | Disposition: A | Discharge status: Home / Self Care | Payer: BC Managed Care – PPO | Source: Ambulatory Visit

## 2012-12-23 DIAGNOSIS — O923 Agalactia: Secondary | ICD-10-CM | POA: Insufficient documentation

## 2013-01-04 ENCOUNTER — Telehealth: Payer: Self-pay | Admitting: Oncology

## 2013-01-04 ENCOUNTER — Other Ambulatory Visit: Payer: BC Managed Care – PPO | Admitting: Lab

## 2013-01-04 NOTE — Telephone Encounter (Signed)
pt called to r/s lab....done °

## 2013-01-09 ENCOUNTER — Other Ambulatory Visit (HOSPITAL_BASED_OUTPATIENT_CLINIC_OR_DEPARTMENT_OTHER): Payer: BC Managed Care – PPO | Admitting: Lab

## 2013-01-09 DIAGNOSIS — D72819 Decreased white blood cell count, unspecified: Secondary | ICD-10-CM

## 2013-01-09 LAB — CBC WITH DIFFERENTIAL/PLATELET
Basophils Absolute: 0 10*3/uL (ref 0.0–0.1)
Eosinophils Absolute: 0.1 10*3/uL (ref 0.0–0.5)
HCT: 39.5 % (ref 34.8–46.6)
HGB: 13.6 g/dL (ref 11.6–15.9)
MONO#: 0.3 10*3/uL (ref 0.1–0.9)
NEUT#: 1.9 10*3/uL (ref 1.5–6.5)
NEUT%: 57.4 % (ref 38.4–76.8)
RDW: 12.1 % (ref 11.2–14.5)
lymph#: 1.1 10*3/uL (ref 0.9–3.3)

## 2013-01-10 ENCOUNTER — Telehealth: Payer: Self-pay | Admitting: *Deleted

## 2013-01-10 ENCOUNTER — Encounter: Payer: Self-pay | Admitting: *Deleted

## 2013-01-10 ENCOUNTER — Other Ambulatory Visit: Payer: Self-pay | Admitting: *Deleted

## 2013-01-10 NOTE — Progress Notes (Signed)
Spoke with patient, told her platelets were 73,000, per dr Gaylyn Rong, nothing to do until they drop to 50,000. Keep regularly scheduled appt.

## 2013-01-10 NOTE — Telephone Encounter (Signed)
Message copied by Reesa Chew on Tue Jan 10, 2013  4:45 PM ------      Message from: Wende Mott      Created: Tue Jan 10, 2013  4:10 PM                   ----- Message -----         From: Myrtis Ser, NP         Sent: 01/10/2013  10:26 AM           To: Marlowe Aschoff, RN            Please call pt. WBC is stable. Recommend continued observation. ------

## 2013-01-10 NOTE — Telephone Encounter (Signed)
Spoke with patient, re lab results.

## 2013-01-23 ENCOUNTER — Encounter (HOSPITAL_COMMUNITY)
Admission: RE | Admit: 2013-01-23 | Discharge: 2013-01-23 | Disposition: A | Discharge status: Home / Self Care | Payer: BC Managed Care – PPO | Source: Ambulatory Visit

## 2013-01-23 DIAGNOSIS — O923 Agalactia: Secondary | ICD-10-CM | POA: Insufficient documentation

## 2013-02-23 ENCOUNTER — Encounter (HOSPITAL_COMMUNITY)
Admission: RE | Admit: 2013-02-23 | Discharge: 2013-02-23 | Disposition: A | Discharge status: Home / Self Care | Payer: BC Managed Care – PPO | Source: Ambulatory Visit

## 2013-02-23 DIAGNOSIS — O923 Agalactia: Secondary | ICD-10-CM | POA: Insufficient documentation

## 2013-03-26 ENCOUNTER — Encounter (HOSPITAL_COMMUNITY)
Admission: RE | Admit: 2013-03-26 | Discharge: 2013-03-26 | Disposition: A | Discharge status: Home / Self Care | Payer: BC Managed Care – PPO | Source: Ambulatory Visit

## 2013-03-26 DIAGNOSIS — O923 Agalactia: Secondary | ICD-10-CM | POA: Insufficient documentation

## 2013-04-05 ENCOUNTER — Telehealth: Payer: Self-pay | Admitting: Hematology and Oncology

## 2013-04-05 ENCOUNTER — Other Ambulatory Visit: Payer: Self-pay | Admitting: Hematology and Oncology

## 2013-04-05 DIAGNOSIS — D72819 Decreased white blood cell count, unspecified: Secondary | ICD-10-CM

## 2013-04-05 NOTE — Telephone Encounter (Signed)
called pt,left message regarding appt tomorrow and MD vsiit on december 2014

## 2013-04-06 ENCOUNTER — Other Ambulatory Visit: Payer: BC Managed Care – PPO | Admitting: Lab

## 2013-04-06 ENCOUNTER — Other Ambulatory Visit: Payer: Self-pay

## 2013-04-26 ENCOUNTER — Encounter (HOSPITAL_COMMUNITY)
Admission: RE | Admit: 2013-04-26 | Discharge: 2013-04-26 | Disposition: A | Discharge status: Home / Self Care | Payer: BC Managed Care – PPO | Source: Ambulatory Visit

## 2013-04-26 DIAGNOSIS — O923 Agalactia: Secondary | ICD-10-CM | POA: Insufficient documentation

## 2013-05-04 ENCOUNTER — Other Ambulatory Visit: Payer: BC Managed Care – PPO | Admitting: Lab

## 2013-05-04 ENCOUNTER — Ambulatory Visit: Payer: BC Managed Care – PPO | Admitting: Hematology and Oncology

## 2013-07-07 ENCOUNTER — Other Ambulatory Visit: Payer: BC Managed Care – PPO | Admitting: Lab

## 2013-07-07 ENCOUNTER — Ambulatory Visit: Payer: BC Managed Care – PPO | Admitting: Oncology

## 2014-03-13 ENCOUNTER — Telehealth: Payer: Self-pay | Admitting: Hematology and Oncology

## 2014-03-13 NOTE — Telephone Encounter (Signed)
LEFT MESSAGE FOR PATIENT AND GAVE APPT FOR 10/26 @ 2 W/DR. GORSUCH. LEFT CONTACT FOR PATIENT TO RETURN CALL TO CONFIRM MESSAGE/APPT.

## 2014-03-13 NOTE — Telephone Encounter (Signed)
Pt called to confirm appt

## 2014-03-19 ENCOUNTER — Telehealth: Payer: Self-pay | Admitting: Hematology and Oncology

## 2014-03-19 NOTE — Telephone Encounter (Signed)
returned pt call and r/s appt per pt request....pt ok adn aware of new d.

## 2014-03-26 ENCOUNTER — Ambulatory Visit: Payer: BC Managed Care – PPO | Admitting: Hematology and Oncology

## 2014-04-02 ENCOUNTER — Encounter: Payer: Self-pay | Admitting: Oncology

## 2014-04-06 ENCOUNTER — Telehealth: Payer: Self-pay | Admitting: Hematology and Oncology

## 2014-04-06 ENCOUNTER — Encounter: Payer: Self-pay | Admitting: Hematology and Oncology

## 2014-04-06 ENCOUNTER — Encounter: Payer: Self-pay | Admitting: *Deleted

## 2014-04-06 ENCOUNTER — Ambulatory Visit (HOSPITAL_BASED_OUTPATIENT_CLINIC_OR_DEPARTMENT_OTHER): Payer: BC Managed Care – PPO | Admitting: Hematology and Oncology

## 2014-04-06 VITALS — BP 111/66 | HR 98 | Temp 99.0°F | Resp 18 | Ht 64.0 in | Wt 106.4 lb

## 2014-04-06 DIAGNOSIS — D72819 Decreased white blood cell count, unspecified: Secondary | ICD-10-CM

## 2014-04-06 NOTE — Telephone Encounter (Signed)
gv and printed appt sched and av sfo rpt for July 2016 °

## 2014-04-06 NOTE — Assessment & Plan Note (Signed)
Clinically, she is not symptomatic. I suspect she may have mild nutritional deficiency due to significant weight loss and stress with her last pregnancy. I recommend observation only and plan to see her back in the summer for repeat history, physical examination and blood work.

## 2014-04-06 NOTE — Progress Notes (Signed)
[facility] FOLLOW-UP progress notes  Patient Care Team: Elie Goodyammy Worrell, NP as PCP - General (Nurse Practitioner) Gaylord Shihhomas C Wall, MD as Consulting Physician (Cardiology) Jaymes GraffNaima Dillard, MD as Consulting Physician (Obstetrics and Gynecology)  CHIEF COMPLAINTS/PURPOSE OF VISIT:  Chronic leukopenia  HISTORY OF PRESENTING ILLNESS:  Erica Ramsey 38 y.o. female was transferred to my care after her prior physician has left.  I reviewed the patient's records extensive and collaborated the history with the patient. Summary of her history is as follows: This patient was seen in the past by another hematologist for mild thrombocytopenia and leukopenia. She had normal blood work in 2009 and 2013. Since 2013, she had low white blood cell count ranging around 1.9-3.5. Since her last child, the patient has lost over 30 pounds of weight and was nursing her daughter. She had a lot of stress in the last 2 years and just weaned her daughter off breast milk. She denies recurrent infection. Denies any joint swelling, skin rashes or prior diagnosis of autoimmune disorders. MEDICAL HISTORY:  Past Medical History  Diagnosis Date  . Anxiety   . Hypothyroidism   . Anemia   . Thrombocytopenia   . Leukocytopenia   . Palpitation     resolved    SURGICAL HISTORY: Past Surgical History  Procedure Laterality Date  . Wisdom tooth extraction      SOCIAL HISTORY: History   Social History  . Marital Status: Married    Spouse Name: N/A    Number of Children: 4  . 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 Smoking status: Never Smoker   . Smokeless tobacco: Never Used  . Alcohol Use: No  . Drug Use: No  . Sexual Activity:    Partners: Male    Birth Control/ Protection: None   Other Topics Concern  . Not on file   Social History Narrative    FAMILY HISTORY: Family History  Problem Relation Age of  Onset  . Hypertension Other   . Cancer Maternal Grandmother     Head/neck cancer    ALLERGIES:  is allergic to doxycycline and erythromycin.  MEDICATIONS:  No current outpatient prescriptions on file.   No current facility-administered medications for this visit.    REVIEW OF SYSTEMS:   Constitutional: Denies fevers, chills or abnormal night sweats Eyes: Denies blurriness of vision, double vision or watery eyes Ears, nose, mouth, throat, and face: Denies mucositis or sore throat Respiratory: Denies cough, dyspnea or wheezes Cardiovascular: Denies palpitation, chest discomfort or lower extremity swelling Gastrointestinal:  Denies nausea, heartburn or change in bowel habits Skin: Denies abnormal skin rashes Lymphatics: Denies new lymphadenopathy or easy bruising Neurological:Denies numbness, tingling or new weaknesses Behavioral/Psych: Mood is stable, no new changes  All other systems were reviewed with the patient and are negative.  PHYSICAL EXAMINATION: ECOG PERFORMANCE STATUS: 0 - Asymptomatic  Filed Vitals:   04/06/14 0852  BP: 111/66  Pulse: 98  Temp: 99 F (37.2 C)  Resp: 18   Filed Weights   04/06/14 0852  Weight: 106 lb 6.4 oz (48.263 kg)    GENERAL:alert, no distress and comfortable. She looks thin SKIN: skin color, texture, turgor are normal, no rashes or significant lesions EYES: normal, conjunctiva are pink and non-injected, sclera clear OROPHARYNX:no exudate, normal lips, buccal mucosa, and tongue  NECK: supple, thyroid normal size, non-tender, without nodularity LYMPH:  no palpable lymphadenopathy in the  cervical, axillary or inguinal LUNGS: clear to auscultation and percussion with normal breathing effort HEART: regular rate & rhythm and no murmurs without lower extremity edema ABDOMEN:abdomen soft, non-tender and normal bowel sounds Musculoskeletal:no cyanosis of digits and no clubbing  PSYCH: alert & oriented x 3 with fluent speech NEURO: no focal  motor/sensory deficits  LABORATORY DATA:  I have reviewed the data as listed Lab Results  Component Value Date   WBC 3.3* 01/09/2013   HGB 13.6 01/09/2013   HCT 39.5 01/09/2013   MCV 87.6 01/09/2013   PLT 161 01/09/2013   No results for input(s): NA, K, CL, CO2, GLUCOSE, BUN, CREATININE, CALCIUM, GFRNONAA, GFRAA, PROT, ALBUMIN, AST, ALT, ALKPHOS, BILITOT, BILIDIR, IBILI in the last 8760 hours. ASSESSMENT & PLAN:  Leukopenia Clinically, she is not symptomatic. I suspect she may have mild nutritional deficiency due to significant weight loss and stress with her last pregnancy. I recommend observation only and plan to see her back in the summer for repeat history, physical examination and blood work.   Orders Placed This Encounter  Procedures  . CBC with Differential    Standing Status: Future     Number of Occurrences:      Standing Expiration Date: 05/11/2015  . Sedimentation rate    Standing Status: Future     Number of Occurrences:      Standing Expiration Date: 05/11/2015  . Morphology    Standing Status: Future     Number of Occurrences:      Standing Expiration Date: 05/11/2015    All questions were answered. The patient knows to call the clinic with any problems, questions or concerns. I spent 15 minutes counseling the patient face to face. The total time spent in the appointment was 20 minutes and more than 50% was on counseling.     Raye Slyter, MD 04/06/2014 9:27 AM

## 2014-06-01 NOTE — L&D Delivery Note (Addendum)
Delivery Note At 9:03 PM a viable female, "Erica Ramsey", was delivered via Vaginal, Spontaneous Delivery (Presentation: Left Occiput Anterior) with patient in standing position at bedside.  SROM just prior to delivery, with light MSF noted.  APGAR: 8, 10; weight  9+15.   Placenta status: Intact, Spontaneous.  Cord: 3 vessels with the following complications: None.  Cord pH: NA  Baby with slight facial bruising.  No shoulder dystocia noted.  1/2 cm inclusion cyst noted just inside introitus on right side.  Pierced with sterile needle, with extrusion of creamy white material.  Cyst completely disappeared after draining.  Anesthesia: Local for repair Episiotomy: None Lacerations: 1st degree perineal Suture Repair: 3.0 vicryl Est. Blood Loss (mL):  300 cc  Mom to postpartum.  Baby to Couplet care / Skin to Skin. Patient declines all infant meds, Rhophylac, and TDAP. 10 u pitocin given IM after delivery for bleeding prophylaxis. Uterus firm at completion of perineal repair. Patient's husband had vasectomy during patient's pregnancy.  Nigel BridgemanLATHAM, Erica Ramsey 04/02/2015, 9:50 PM  Addendum: Moderate bleeding noted on pp check--uterus slightly boggy, firm with massage. Methergine 0.2 mg IM administered, will administer po methergine course pp x 24 hours.  Note:  Patient declined IM Methergine, accepting of po Methergine course.  Nigel BridgemanVicki [name], CNM 04/02/15 10:24p

## 2014-08-09 LAB — OB RESULTS CONSOLE RPR: RPR: NONREACTIVE

## 2014-08-09 LAB — OB RESULTS CONSOLE GC/CHLAMYDIA
CHLAMYDIA, DNA PROBE: NEGATIVE
Gonorrhea: NEGATIVE

## 2014-08-09 LAB — OB RESULTS CONSOLE HIV ANTIBODY (ROUTINE TESTING): HIV: NONREACTIVE

## 2014-08-09 LAB — OB RESULTS CONSOLE ANTIBODY SCREEN: ANTIBODY SCREEN: NEGATIVE

## 2014-08-09 LAB — OB RESULTS CONSOLE ABO/RH: RH TYPE: NEGATIVE

## 2014-08-09 LAB — OB RESULTS CONSOLE RUBELLA ANTIBODY, IGM: Rubella: IMMUNE

## 2014-08-09 LAB — OB RESULTS CONSOLE HEPATITIS B SURFACE ANTIGEN: HEP B S AG: NEGATIVE

## 2014-11-29 ENCOUNTER — Telehealth: Payer: Self-pay | Admitting: Hematology and Oncology

## 2014-11-29 ENCOUNTER — Telehealth: Payer: Self-pay | Admitting: *Deleted

## 2014-11-29 NOTE — Telephone Encounter (Signed)
OK, I went ahead and cancelled her appointment

## 2014-11-29 NOTE — Telephone Encounter (Signed)
Pt is five months pregnant and says her last WBC is ok at 3.9.  She asks if ok to cancel her appt w/ Dr. Bertis RuddyGorsuch next week on 7/7?  She thinks her labs will be monitored pretty closely while she is pregnant and if her counts drop again she will call to r/s.  She just wanted to make sure this is the right thing to?  Informed pt this sounds ok but I will check w/ Dr. Bertis RuddyGorsuch.  I will call her back today if pt should keep her appt next.  Pt verbalized understanding.

## 2014-11-29 NOTE — Telephone Encounter (Signed)
pt called to advised that her labs are better and she is pregnant....she wanted to know if she needed to keep her appt....i transferred her to desk nurse

## 2014-12-06 ENCOUNTER — Ambulatory Visit: Payer: BC Managed Care – PPO | Admitting: Hematology and Oncology

## 2014-12-06 ENCOUNTER — Other Ambulatory Visit: Payer: BC Managed Care – PPO

## 2015-03-12 LAB — OB RESULTS CONSOLE GBS: GBS: NEGATIVE

## 2015-04-02 ENCOUNTER — Inpatient Hospital Stay (HOSPITAL_COMMUNITY)
Admission: AD | Admit: 2015-04-02 | Discharge: 2015-04-04 | DRG: 775 | Disposition: A | Discharge status: Home / Self Care | Payer: BLUE CROSS/BLUE SHIELD | Source: Ambulatory Visit | Attending: Obstetrics and Gynecology | Admitting: Obstetrics and Gynecology

## 2015-04-02 ENCOUNTER — Encounter (HOSPITAL_COMMUNITY): Payer: Self-pay | Admitting: *Deleted

## 2015-04-02 DIAGNOSIS — E039 Hypothyroidism, unspecified: Secondary | ICD-10-CM | POA: Diagnosis present

## 2015-04-02 DIAGNOSIS — O99284 Endocrine, nutritional and metabolic diseases complicating childbirth: Secondary | ICD-10-CM | POA: Diagnosis present

## 2015-04-02 DIAGNOSIS — Z8249 Family history of ischemic heart disease and other diseases of the circulatory system: Secondary | ICD-10-CM

## 2015-04-02 DIAGNOSIS — O26893 Other specified pregnancy related conditions, third trimester: Secondary | ICD-10-CM | POA: Diagnosis present

## 2015-04-02 DIAGNOSIS — N898 Other specified noninflammatory disorders of vagina: Secondary | ICD-10-CM | POA: Diagnosis present

## 2015-04-02 DIAGNOSIS — Z3A38 38 weeks gestation of pregnancy: Secondary | ICD-10-CM

## 2015-04-02 DIAGNOSIS — Z6791 Unspecified blood type, Rh negative: Secondary | ICD-10-CM | POA: Diagnosis not present

## 2015-04-02 HISTORY — DX: Partial loss of teeth, unspecified cause, unspecified class: K08.409

## 2015-04-02 LAB — CBC
HCT: 36 % (ref 36.0–46.0)
HEMOGLOBIN: 11.9 g/dL — AB (ref 12.0–15.0)
MCH: 28.1 pg (ref 26.0–34.0)
MCHC: 33.1 g/dL (ref 30.0–36.0)
MCV: 84.9 fL (ref 78.0–100.0)
PLATELETS: 153 10*3/uL (ref 150–400)
RBC: 4.24 MIL/uL (ref 3.87–5.11)
RDW: 14.9 % (ref 11.5–15.5)
WBC: 7.8 10*3/uL (ref 4.0–10.5)

## 2015-04-02 LAB — TYPE AND SCREEN
ABO/RH(D): A NEG
Antibody Screen: NEGATIVE

## 2015-04-02 MED ORDER — BENZOCAINE-MENTHOL 20-0.5 % EX AERO: 1.0000 "application " | INHALATION_SPRAY | CUTANEOUS | Status: DC | PRN

## 2015-04-02 MED ORDER — FENTANYL CITRATE (PF) 100 MCG/2ML IJ SOLN: 100.0000 ug | INTRAMUSCULAR | Status: DC | PRN

## 2015-04-02 MED ORDER — OXYCODONE-ACETAMINOPHEN 5-325 MG PO TABS: 1.0000 | ORAL_TABLET | ORAL | Status: DC | PRN

## 2015-04-02 MED ORDER — METHYLERGONOVINE MALEATE 0.2 MG PO TABS: 0.2000 mg | ORAL_TABLET | Freq: Four times a day (QID) | ORAL | Status: AC

## 2015-04-02 MED ORDER — ONDANSETRON HCL 4 MG PO TABS: 4.0000 mg | ORAL_TABLET | ORAL | Status: DC | PRN

## 2015-04-02 MED ORDER — ONDANSETRON HCL 4 MG/2ML IJ SOLN: 4.0000 mg | INTRAMUSCULAR | Status: DC | PRN

## 2015-04-02 MED ORDER — ZOLPIDEM TARTRATE 5 MG PO TABS: 5.0000 mg | ORAL_TABLET | Freq: Every evening | ORAL | Status: DC | PRN

## 2015-04-02 MED ORDER — LACTATED RINGERS IV SOLN: 500.0000 mL | INTRAVENOUS | Status: DC | PRN

## 2015-04-02 MED ORDER — WITCH HAZEL-GLYCERIN EX PADS: 1.0000 "application " | MEDICATED_PAD | CUTANEOUS | Status: DC | PRN

## 2015-04-02 MED ORDER — OXYCODONE-ACETAMINOPHEN 5-325 MG PO TABS: 2.0000 | ORAL_TABLET | ORAL | Status: DC | PRN

## 2015-04-02 MED ORDER — PRENATAL MULTIVITAMIN CH: 1.0000 | ORAL_TABLET | Freq: Every day | ORAL | Status: DC

## 2015-04-02 MED ORDER — SENNOSIDES-DOCUSATE SODIUM 8.6-50 MG PO TABS
2.0000 | ORAL_TABLET | ORAL | Status: DC
  Filled 2015-04-02: qty 2

## 2015-04-02 MED ORDER — METHYLERGONOVINE MALEATE 0.2 MG/ML IJ SOLN: 0.2000 mg | Freq: Once | INTRAMUSCULAR | Status: DC

## 2015-04-02 MED ORDER — IBUPROFEN 600 MG PO TABS
600.0000 mg | ORAL_TABLET | Freq: Four times a day (QID) | ORAL | Status: DC
  Filled 2015-04-02 (×2): qty 1

## 2015-04-02 MED ORDER — TETANUS-DIPHTH-ACELL PERTUSSIS 5-2.5-18.5 LF-MCG/0.5 IM SUSP: 0.5000 mL | Freq: Once | INTRAMUSCULAR | Status: DC

## 2015-04-02 MED ORDER — LIDOCAINE HCL (PF) 1 % IJ SOLN
30.0000 mL | INTRAMUSCULAR | Status: DC | PRN
  Administered 2015-04-02: 30 mL via SUBCUTANEOUS
  Filled 2015-04-02: qty 30

## 2015-04-02 MED ORDER — DIBUCAINE 1 % RE OINT: 1.0000 "application " | TOPICAL_OINTMENT | RECTAL | Status: DC | PRN

## 2015-04-02 MED ORDER — FLEET ENEMA 7-19 GM/118ML RE ENEM
1.0000 | ENEMA | RECTAL | Status: DC | PRN
Start: 2015-04-02 — End: 2015-04-02

## 2015-04-02 MED ORDER — CITRIC ACID-SODIUM CITRATE 334-500 MG/5ML PO SOLN: 30.0000 mL | ORAL | Status: DC | PRN

## 2015-04-02 MED ORDER — OXYTOCIN 10 UNIT/ML IJ SOLN
INTRAMUSCULAR | Status: AC
  Administered 2015-04-02: 10 [IU] via INTRAMUSCULAR
  Filled 2015-04-02: qty 1

## 2015-04-02 MED ORDER — ACETAMINOPHEN 325 MG PO TABS: 650.0000 mg | ORAL_TABLET | ORAL | Status: DC | PRN

## 2015-04-02 MED ORDER — DIPHENHYDRAMINE HCL 25 MG PO CAPS: 25.0000 mg | ORAL_CAPSULE | Freq: Four times a day (QID) | ORAL | Status: DC | PRN

## 2015-04-02 MED ORDER — LANOLIN HYDROUS EX OINT: TOPICAL_OINTMENT | CUTANEOUS | Status: DC | PRN

## 2015-04-02 MED ORDER — SIMETHICONE 80 MG PO CHEW
80.0000 mg | CHEWABLE_TABLET | ORAL | Status: DC | PRN
Start: 2015-04-02 — End: 2015-04-04

## 2015-04-02 MED ORDER — ONDANSETRON HCL 4 MG/2ML IJ SOLN: 4.0000 mg | Freq: Four times a day (QID) | INTRAMUSCULAR | Status: DC | PRN

## 2015-04-02 NOTE — MAU Note (Signed)
BROUGHT  PT  FROM  LOBBY    TO   RM  5     WITH W/C   VERY UNCOMFORTABLE    WITH BACK  HURTING-      HAS BEEN HAVING  UC   THEN  WENT  FOR  A MASSAGE.   NO VE IN OFFICE.  DENIES HSV AND MRSA.  GBS- NEG

## 2015-04-02 NOTE — H&P (Signed)
Erica Ramsey is a 39 y.o. female, G5P4004 at  2338 6/7 weeks, presenting for active labor--UCs began around 5pm, with rapid progression to current q 3 min.  Denies leaking or bleeding, reports +FM.  Hx rapid labor with all deliveries, desires non-interventive birth.  Patient Active Problem List   Diagnosis Date Noted  . Normal labor 04/02/2015  . Leukopenia   . Lactating mother 06/24/2011  . Thrombocytopenia (HCC) 06/24/2011  . SVD (spontaneous vaginal delivery) 06/23/2011  . Hypothyroidism 06/23/2011  . Blood type, Rh negative 06/23/2011  . ANXIETY 12/05/2009  . PALPITATIONS 12/05/2009  Palpitations evaluated 2012 with cardiology referral--negative findings and normal echo. Recommended to avoid Diflucan/azoles due to possible increase in palpitations and possible QT prolongation, although patient has taken these meds without difficulty.  History of present pregnancy: Patient entered care at 10 1/7 weeks.   EDC of 04/10/15 was established by LMP and in agreement with US at 19 weeks.  Anatomy scan:  19 2/7 weeks, with limited views of heart and face, posterior right placenta.  EFW 94%ile. Additional US evaluations:  None--patient declined further views due to cost.   Significant prenatal events:  Declined TDAP and Rhophylac.  Husband had vasectomy during patient's pregnancy.  Patient's CBC and thyroid panel followed during pregnancy, with platelet nadir to 136, WBC ct nadir 3.4 in March, and thyroid panels WNL.  Declined genetic testing.  Received flu vaccine 03/01/15.   Last evaluation:  03/28/15--BP 100/60, no recent VE.  OB History    Gravida Para Term Preterm AB TAB SAB Ectopic Multiple Living   5 4 4       4     1996--[facility]B, 39 5/7 weeks, 5 hour labor, 7+6, female, no meds, delivered at [facility]--reports "placenta ruptured at first delivery" 2002--[facility], 39 3/7 weeks, 3 hour labor, 8+13, female, [facility], [facility] 2009--[facility], 39 weeks, 3 hour labor, 10 lbs, female, [facility], fractured clavicle at delivery,  [facility] 2013--[facility], 39 5/7 weeks, 3 hour labor, 8+12, female, [facility], [facility]  Past Medical History  Diagnosis Date  . Anxiety   . Hypothyroidism   . Anemia   . Thrombocytopenia (HCC)   . Leukocytopenia   . Palpitation     resolved   Past Surgical History  Procedure Laterality Date  . Wisdom tooth extraction     Family History: family history includes Cancer in her maternal grandmother; Hypertension in her other.   Social History:  reports that she has never smoked. She has never used smokeless tobacco. She reports that she does not drink alcohol or use illicit drugs.  Patient is Caucasian, of the [place] faith, married to Erica Ramsey, who is involved and supportive.  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   Prenatal Transfer Tool  Maternal Diabetes: No Genetic Screening: Declined Maternal Ultrasounds/Referrals: Normal Fetal Ultrasounds or other Referrals:  None Maternal Substance Abuse:  No Significant Maternal Medications:  None Significant Maternal Lab Results: Lab values include: Group B Strep negative, Rh negative (declined Rhophylac during pregnancy).  Thyroid testing WNL during pregnancy  TDAP Declined Flu 03/01/15 per patient  ROS:  Contractions, +FM  Allergies  Allergen Reactions  . Doxycycline Nausea Only  . Erythromycin Nausea Only  . Shellfish Allergy Other (See Comments)    Causes tingling of the lips.     Dilation: 7.5 Effacement (%): 90 Station: -1 Exam by:: Erica MuffWeston, rn currently breastfeeding.  Chest clear Heart RRR without murmur Abd gravid, NT, FH 40 cm Pelvic: as above on  arrival, IBOW Ext: WNL  FHR: Category 1 UCs:  q 3 min, strong.  Prenatal labs: ABO, Rh: A/Negative/-- (03/10 0000) Antibody: Negative (03/10 0000) Rubella:  Immune RPR: Nonreactive (03/10 0000)  HBsAg: Negative (03/10 0000)  HIV: Non-reactive (03/10 0000)  GBS: Negative (10/11 0000) Sickle cell/Hgb electrophoresis:  NA Pap:   09/13/14 WNL GC:  Negative 08/09/14 Chlamydia:  Negative 08/09/14 Genetic screenings:  Declined Glucola:  WNL Other:   Hgb 13.4 at NOB, 10.5 at 28 weeks TSH WNL 08/09/14, 11/16/14, 01/14/15 (with normal T4 and T3 Vit D 26 on 03/12/15. 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 Will decline all baby meds (Vit K, erythromycin ointment, Hep B vaccine).  Plan: Admit to Birthing Suite per consult with Dr. Richardson Ramsey on-call for [facility]. Routine [facility] orders Will defer IV initiation per patient request--advised patient I would utilize IV if needed for bleeding control or if FHR non-reassuring. Anticipate SVB  Erica Ramsey, [name], [place] 04/02/2015, 8:36 PM

## 2015-04-03 ENCOUNTER — Encounter (HOSPITAL_COMMUNITY): Payer: Self-pay

## 2015-04-03 LAB — CBC
HCT: 31.2 % — ABNORMAL LOW (ref 36.0–46.0)
Hemoglobin: 10.2 g/dL — ABNORMAL LOW (ref 12.0–15.0)
MCH: 27.9 pg (ref 26.0–34.0)
MCHC: 32.7 g/dL (ref 30.0–36.0)
MCV: 85.2 fL (ref 78.0–100.0)
PLATELETS: 129 10*3/uL — AB (ref 150–400)
RBC: 3.66 MIL/uL — ABNORMAL LOW (ref 3.87–5.11)
RDW: 14.8 % (ref 11.5–15.5)
WBC: 7.3 10*3/uL (ref 4.0–10.5)

## 2015-04-03 LAB — HIV ANTIBODY (ROUTINE TESTING W REFLEX): HIV SCREEN 4TH GENERATION: NONREACTIVE

## 2015-04-03 LAB — RPR: RPR: NONREACTIVE

## 2015-04-03 NOTE — Progress Notes (Signed)
Subjective: Postpartum Day 1. Vaginal delivery, First degree perineal laceration Patient up ad lib, reports no syncope or dizziness. Feeding:  breast Contraceptive plan:  Husband has a vasectomy  Objective: Vital signs in last 24 hours: Temp:  [98.2 F (36.8 C)-98.6 F (37 C)] 98.6 F (37 C) (11/02 0500) Pulse Rate:  [59-90] 73 (11/02 0500) Resp:  [18-20] 20 (11/02 0500) BP: (100-156)/(58-123) 100/58 mmHg (11/02 0500) Weight:  [63.957 kg (141 lb)] 63.957 kg (141 lb) (11/01 2055)  Physical Exam:  General: alert and cooperative Lochia: appropriate Uterine Fundus: firm Perineum: healing well DVT Evaluation: No evidence of DVT seen on physical exam.   CBC Latest Ref Rng 04/03/2015 04/02/2015 01/09/2013  WBC 4.0 - 10.5 K/uL 7.3 7.8 3.3(L)  Hemoglobin 12.0 - 15.0 g/dL 10.2(L) 11.9(L) 13.6  Hematocrit 36.0 - 46.0 % 31.2(L) 36.0 39.5  Platelets 150 - 400 K/uL 129(L) 153 161     Assessment/Plan: Status post vaginal delivery day 1. Pt will continue to take home medication for Iron Stable Continue current care. Plan for discharge tomorrow    Beatrix FettersRachel StallCNM 04/03/2015, 4:34 PM

## 2015-04-03 NOTE — Lactation Note (Addendum)
This note was copied from the chart of Erica Ramsey. Lactation Consultation Note Experienced BF mom has 4 other children which she BF 3 months, 6 months, 9 months, and 2 1/2 yrs. Had a lot of nipple pain with the last one. Had latching issues. Has sore nipples but states it is from after delivery and mom was weak and tired baby latched shallow. States she is doing much better and hearing gulping. Comfort gels given. Discussed positioning and chin tug. Referred to Baby and Me Book in Breastfeeding section Pg. 22-23 for position options and Proper latch demonstration.Mom encouraged to do skin-to-skin. WH/LC brochure given w/resources, support groups and LC services. Baby's face is bruised. Discussed risk of jaundice and encouraged lots of BF.  Patient Name: Erica Ramsey ZOXWR'UToday's Date: 04/03/2015 Reason for consult: Initial assessment   Maternal Data Has patient been taught Hand Expression?: Yes Does the patient have breastfeeding experience prior to this delivery?: Yes  Feeding Feeding Type: Breast Fed Length of feed: 15 min  LATCH Score/Interventions       Type of Nipple: Everted at rest and after stimulation  Comfort (Breast/Nipple): Filling, red/small blisters or bruises, mild/mod discomfort  Problem noted: Mild/Moderate discomfort Interventions (Mild/moderate discomfort): Comfort gels;Hand massage;Hand expression  Intervention(s): Breastfeeding basics reviewed;Support Pillows;Position options;Skin to skin     Lactation Tools Discussed/Used Tools: Comfort gels   Consult Status Consult Status: Follow-up Date: 04/04/15 Follow-up type: In-patient    Charyl DancerCARVER, Hyacinth Marcelli G 04/03/2015, 5:36 AM

## 2015-04-03 NOTE — Progress Notes (Signed)
[name] was referred for history of depression/anxiety.  Referral is screened out by Clinical Social Worker because none of the following criteria appear to apply: -History of anxiety/depression during this pregnancy, or of post-partum depression. - Diagnosis of anxiety and/or depression within last 3 years - History of depression due to pregnancy loss/loss of child or -[name]'s symptoms are currently being treated with medication and/or therapy.  Per chart, [name] has history of anxiety since prior to 2011. No acute concerns noted in her prenatal records upon chart review.  Please contact the Clinical Social Worker if needs arise or upon [name] request.   Erica BooksSarah Lailah Ramsey MSW, LCSW [phone]

## 2015-04-04 NOTE — Discharge Summary (Signed)
OB Discharge Summary     Patient Name: Erica Ramsey DOB: 1976-02-20 MRN: [id number]  Date of admission: 04/02/2015 Delivering MD: Nigel Bridgeman   Date of discharge: 04/04/2015  Admitting diagnosis: 39W CTX 3-5 MIN,LOTS OF PRESSURE Intrauterine pregnancy: [redacted]w[redacted]d     Secondary diagnosis:  Principal Problem:   Vaginal delivery  Additional problems: none     Discharge diagnosis: Term Pregnancy Delivered                                                                                                Post partum procedures:None  Augmentation: None  Complications: None  Hospital course:  Onset of Labor With Vaginal Delivery     39 y.o. yo [id number] at [redacted]w[redacted]d was admitted in Active Laboron 04/02/2015. Patient had an uncomplicated labor course as follows:  Membrane Rupture Time/Date: 8:58 PM ,04/02/2015   Intrapartum Procedures: Episiotomy: None [1]                                         Lacerations:  1st degree [2];Perineal [11]  Patient had a delivery of a Viable infant. 04/02/2015  Information for the patient's newborn:  Taina, Landry Girl Kynsie [[id number]]  Delivery Method: Vaginal, Spontaneous Delivery (Filed from Delivery Summary)    Pateint had an uncomplicated postpartum course.  She is ambulating, tolerating a regular diet, passing flatus, and urinating well. Patient is discharged home in stable condition on No discharge date for patient encounter.Marland Kitchen    Physical exam  Filed Vitals:   04/03/15 0047 04/03/15 0500 04/03/15 1755 04/04/15 0551  BP: 115/59 100/58 99/48 92/51   Pulse: 78 73 72 65  Temp: 98.2 F (36.8 C) 98.6 F (37 C) 98.7 F (37.1 C) 98 F (36.7 C)  TempSrc: Oral Oral Oral Oral  Resp: Height:      Weight:       General: alert and cooperative Lochia: appropriate Uterine Fundus: firm Incision: Healing well with no significant drainage DVT Evaluation: No evidence of DVT seen on physical exam. Labs: Lab Results  Component Value Date   WBC 7.3  04/03/2015   HGB 10.2* 04/03/2015   HCT 31.2* 04/03/2015   MCV 85.2 04/03/2015   PLT 129* 04/03/2015   CMP Latest Ref Rng 07/07/2012  Glucose 70 - 99 mg/dl 696(E)  BUN 7.0 - 95.2 mg/dL 84.1  Creatinine 0.6 - 1.1 mg/dL 0.8  Sodium 324 - 401 mEq/L 141  Potassium 3.5 - 5.1 mEq/L 3.6  Chloride 98 - 107 mEq/L 104  CO2 22 - 29 mEq/L 27  Calcium 8.4 - 10.4 mg/dL 9.2  Total Protein 6.4 - 8.3 g/dL 7.1  Total Bilirubin 0.27 - 1.20 mg/dL 2.53  Alkaline Phos 40 - 150 U/L 65  AST 5 - 34 U/L 16  ALT 0 - 55 U/L 29    Discharge instruction: per After Visit Summary and "Baby and Me Booklet".  Medications:  Current facility-administered medications:  .  acetaminophen (TYLENOL) tablet 650 mg, 650 mg,  Oral, Q4H PRN, Nigel BridgemanVicki Latham, CNM .  benzocaine-Menthol (DERMOPLAST) 20-0.5 % topical spray 1 application, 1 application, Topical, PRN, Nigel BridgemanVicki Latham, CNM .  witch hazel-glycerin (TUCKS) pad 1 application, 1 application, Topical, PRN **AND** dibucaine (NUPERCAINAL) 1 % rectal ointment 1 application, 1 application, Rectal, PRN, Nigel BridgemanVicki Latham, CNM .  diphenhydrAMINE (BENADRYL) capsule 25 mg, 25 mg, Oral, Q6H PRN, Nigel BridgemanVicki Latham, CNM .  ibuprofen (ADVIL,MOTRIN) tablet 600 mg, 600 mg, Oral, 4 times per day, Nigel BridgemanVicki Latham, CNM, 600 mg at 04/03/15 0000 .  lanolin ointment, , Topical, PRN, Nigel BridgemanVicki Latham, CNM .  ondansetron (ZOFRAN) tablet 4 mg, 4 mg, Oral, Q4H PRN **OR** ondansetron (ZOFRAN) injection 4 mg, 4 mg, Intravenous, Q4H PRN, Nigel BridgemanVicki Latham, CNM .  oxyCODONE-acetaminophen (PERCOCET/ROXICET) 5-325 MG per tablet 1 tablet, 1 tablet, Oral, Q4H PRN, Nigel BridgemanVicki Latham, CNM .  oxyCODONE-acetaminophen (PERCOCET/ROXICET) 5-325 MG per tablet 2 tablet, 2 tablet, Oral, Q4H PRN, Nigel BridgemanVicki Latham, CNM .  prenatal multivitamin tablet 1 tablet, 1 tablet, Oral, Q1200, Nigel BridgemanVicki Latham, CNM, 1 tablet at 04/03/15 1200 .  senna-docusate (Senokot-S) tablet 2 tablet, 2 tablet, Oral, Q24H, Nigel BridgemanVicki Latham, CNM, 2 tablet at 04/03/15 0000 .   simethicone (MYLICON) chewable tablet 80 mg, 80 mg, Oral, PRN, Nigel BridgemanVicki Latham, CNM .  zolpidem (AMBIEN) tablet 5 mg, 5 mg, Oral, QHS PRN, Nigel BridgemanVicki Latham, CNM After visit meds:    Medication List    TAKE these medications        B COMPLEX PO  Take 2 sprays by mouth daily.     IRON PO  Take 25 each by mouth daily. Patient takes 25 drops daily of a natural iron supplement.     Selenium 200 MCG Caps  Take 1 capsule by mouth daily.     Vitamin D 2000 UNITS tablet  Take 2,000 Units by mouth daily.        Diet: routine diet  Activity: Advance as tolerated. Pelvic rest for 6 weeks.   Outpatient follow up:6 weeks Follow up Appt:No future appointments. Follow up Visit:No Follow-up on file.  Postpartum contraception: Vasectomy  Newborn Data: Live born female  Birth Weight: 9 lb 15.6 oz (4525 g) APGAR: 8, 10  Baby Feeding: Breast Disposition:home with mother   04/04/2015 Alphonzo Severanceachel Vasilia Dise, CNM

## 2015-04-04 NOTE — Lactation Note (Signed)
This note was copied from the chart of Erica Ramsey. Lactation Consultation Note Mom called for Lactation to check latch. Mom sitting in recliner in cradle position. Mom stated baby was clicking at intervals. Has some pain d/t all ready sore. Has positional stripe w/blister. Repositioned in football position w/props, looked at flange, is open wide. BF great then at intervals hear chicking. Assessed baby's mouth. Has upper lip labial frenulum, possible tongue limitation posterior. I think the baby will look suction and makes clicking noise. Sand which breast deeper and will stop. Baby is deep. Encouraged different positions and reposition lips and chin tug for deep latch.  Patient Name: Erica Ramsey YNWGN'FToday's Date: 04/04/2015 Reason for consult: Follow-up assessment;Breast/nipple pain   Maternal Data    Feeding Feeding Type: Breast Fed Length of feed: 20 min  LATCH Score/Interventions Latch: Grasps breast easily, tongue down, lips flanged, rhythmical sucking.  Audible Swallowing: Spontaneous and intermittent  Type of Nipple: Everted at rest and after stimulation  Comfort (Breast/Nipple): Filling, red/small blisters or bruises, mild/mod discomfort  Problem noted: Mild/Moderate discomfort Interventions (Mild/moderate discomfort): Comfort gels;Hand massage;Hand expression  Hold (Positioning): Assistance needed to correctly position infant at breast and maintain latch. Intervention(s): Position options;Support Pillows  LATCH Score: 8  Lactation Tools Discussed/Used Tools: Comfort gels   Consult Status Consult Status: Follow-up Date: 04/05/15 Follow-up type: In-patient    Charyl DancerCARVER, Aanvi Voyles G 04/04/2015, 7:26 AM

## 2015-04-04 NOTE — Lactation Note (Signed)
This note was copied from the chart of Erica Ramsey. Lactation Consultation Note' Mom reports baby just finished feeding- asleep in mom's arms at this time. Mom reports Vernona RiegerLaura helped her during the night to get deeper latch. Mom with positional stripes noted on both nipples. Suggested changing positions- to football hold occasionally to help nipples to heal. Has comfort gels.No questions at present. To call prn  Patient Name: Erica Erica Ramsey [id number]'s Date: 04/04/2015 Reason for consult: Follow-up assessment   Maternal Data Formula Feeding for Exclusion: No Does the patient have breastfeeding experience prior to this delivery?: Yes  Feeding Feeding Type: Breast Fed Length of feed: 20 min  LATCH Score/Interventions Latch: Grasps breast easily, tongue down, lips flanged, rhythmical sucking.  Audible Swallowing: Spontaneous and intermittent  Type of Nipple: Everted at rest and after stimulation  Comfort (Breast/Nipple): Filling, red/small blisters or bruises, mild/mod discomfort  Problem noted: Mild/Moderate discomfort Interventions (Mild/moderate discomfort): Comfort gels  Hold (Positioning): Assistance needed to correctly position infant at breast and maintain latch. Intervention(s): Position options;Support Pillows  LATCH Score: 8  Lactation Tools Discussed/Used Tools: Comfort gels   Consult Status Consult Status: Complete Date: 04/05/15 Follow-up type: In-patient    Pamelia HoitWeeks, Azar [place] D 04/04/2015, 8:11 AM

## 2015-04-04 NOTE — Lactation Note (Signed)
This note was copied from the chart of Erica Cathe Monsarrie Ormond. Lactation Consultation Note Encouraged mom to call LC to verify latch. Mom stated she was getting really sore and hearing a clicking sound. Baby doesn't open mouth wide enough. Encouraged to do chin tug. Has comfort gels. Discussed positioning and props for comfort. Encouraged to call for assistance. Patient Name: Erica Ramsey [id number]'s Date: 04/04/2015 Reason for consult: Follow-up assessment;Infant weight loss   Maternal Data    Feeding Feeding Type: Breast Fed Length of feed: 20 min  LATCH Score/Interventions       Type of Nipple: Everted at rest and after stimulation  Comfort (Breast/Nipple): Filling, red/small blisters or bruises, mild/mod discomfort  Problem noted: Mild/Moderate discomfort Interventions (Mild/moderate discomfort): Comfort gels;Hand massage;Hand expression        Lactation Tools Discussed/Used     Consult Status Consult Status: Follow-up Date: 04/04/15 Follow-up type: In-patient    Charyl DancerCARVER, Rayshawn Maney G 04/04/2015, 2:58 AM

## 2015-04-04 NOTE — Discharge Instructions (Signed)
Postpartum Care After Vaginal Delivery °After you deliver your newborn (postpartum period), the usual stay in the hospital is 24-72 hours. If there were problems with your labor or delivery, or if you have other medical problems, you might be in the hospital longer.  °While you are in the hospital, you will receive help and instructions on how to care for yourself and your newborn during the postpartum period.  °While you are in the hospital: °· Be sure to tell your nurses if you have pain or discomfort, as well as where you feel the pain and what makes the pain worse. °· If you had an incision made near your vagina (episiotomy) or if you had some tearing during delivery, the nurses may put ice packs on your episiotomy or tear. The ice packs may help to reduce the pain and swelling. °· If you are breastfeeding, you may feel uncomfortable contractions of your uterus for a couple of weeks. This is normal. The contractions help your uterus get back to normal size. °· It is normal to have some bleeding after delivery. °· For the first 1-3 days after delivery, the flow is red and the amount may be similar to a period. °· It is common for the flow to start and stop. °· In the first few days, you may pass some small clots. Let your nurses know if you begin to pass large clots or your flow increases. °· Do not  flush blood clots down the toilet before having the nurse look at them. °· During the next 3-10 days after delivery, your flow should become more watery and pink or brown-tinged in color. °· Ten to fourteen days after delivery, your flow should be a small amount of yellowish-white discharge. °· The amount of your flow will decrease over the first few weeks after delivery. Your flow may stop in 6-8 weeks. Most women have had their flow stop by 12 weeks after delivery. °· You should change your sanitary pads frequently. °· Wash your hands thoroughly with soap and water for at least 20 seconds after changing pads, using  the toilet, or before holding or feeding your newborn. °· You should feel like you need to empty your bladder within the first 6-8 hours after delivery. °· In case you become weak, lightheaded, or faint, call your nurse before you get out of bed for the first time and before you take a shower for the first time. °· Within the first few days after delivery, your breasts may begin to feel tender and full. This is called engorgement. Breast tenderness usually goes away within 48-72 hours after engorgement occurs. You may also notice milk leaking from your breasts. If you are not breastfeeding, do not stimulate your breasts. Breast stimulation can make your breasts produce more milk. °· Spending as much time as possible with your newborn is very important. During this time, you and your newborn can feel close and get to know each other. Having your newborn stay in your room (rooming in) will help to strengthen the bond with your newborn.  It will give you time to get to know your newborn and become comfortable caring for your newborn. °· Your hormones change after delivery. Sometimes the hormone changes can temporarily cause you to feel sad or tearful. These feelings should not last more than a few days. If these feelings last longer than that, you should talk to your caregiver. °· If desired, talk to your caregiver about methods of family planning or contraception. °·   Talk to your caregiver about immunizations. Your caregiver may want you to have the following immunizations before leaving the hospital: °· Tetanus, diphtheria, and pertussis (Tdap) or tetanus and diphtheria (Td) immunization. It is very important that you and your family (including grandparents) or others caring for your newborn are up-to-date with the Tdap or Td immunizations. The Tdap or Td immunization can help protect your newborn from getting ill. °· Rubella immunization. °· Varicella (chickenpox) immunization. °· Influenza immunization. You should  receive this annual immunization if you did not receive the immunization during your pregnancy. °  °This information is not intended to replace advice given to you by your health care provider. Make sure you discuss any questions you have with your health care provider. °  °Document Released: 03/15/2007 Document Revised: 02/10/2012 Document Reviewed: 01/13/2012 °Elsevier Interactive Patient Education ©2016 [employer] ° °Iron-Rich Diet °Iron is a mineral that helps your body to produce hemoglobin. Hemoglobin is a protein in your red blood cells that carries oxygen to your body's tissues. Eating too little iron may cause you to feel weak and tired, and it can increase your risk for infection. Eating enough iron is necessary for your body's metabolism, muscle function, and nervous system. °Iron is naturally found in many foods. It can also be added to foods or fortified in foods. There are two types of dietary iron: °· Heme iron. Heme iron is absorbed by the body more easily than nonheme iron. Heme iron is found in meat, poultry, and fish. °· Nonheme iron. Nonheme iron is found in dietary supplements, iron-fortified grains, beans, and vegetables. °You may need to follow an iron-rich diet if: °· You have been diagnosed with iron deficiency or iron-deficiency anemia. °· You have a condition that prevents you from absorbing dietary iron, such as: °¨ Infection in your intestines. °¨ Celiac disease. This involves long-lasting (chronic) inflammation of your intestines. °· You do not eat enough iron. °· You eat a diet that is high in foods that impair iron absorption. °· You have lost a lot of blood. °· You have heavy bleeding during your menstrual cycle. °· You are pregnant. °WHAT IS MY PLAN? °Your health care provider may help you to determine how much iron you need per day based on your condition. Generally, when a person consumes sufficient amounts of iron in the diet, the following iron needs are met: °· Men. °¨ 14-18  years old: 11 mg per day. °¨ 19-50 years old: 8 mg per day. °· Women.   °¨ 14-18 years old: 15 mg per day. °¨ 19-50 years old: 18 mg per day. °¨ Over 50 years old: 8 mg per day. °¨ Pregnant women: 27 mg per day. °¨ Breastfeeding women: 9 mg per day. °WHAT DO I NEED TO KNOW ABOUT AN IRON-RICH DIET? °· Eat fresh fruits and vegetables that are high in vitamin C along with foods that are high in iron. This will help increase the amount of iron that your body absorbs from food, especially with foods containing nonheme iron. Foods that are high in vitamin C include oranges, peppers, tomatoes, and mango. °· Take iron supplements only as directed by your health care provider. Overdose of iron can be life-threatening. If you were prescribed iron supplements, take them with orange juice or a vitamin C supplement. °· Cook foods in pots and pans that are made from iron.   °· Eat nonheme iron-containing foods alongside foods that are high in heme iron. This helps to improve your iron absorption.   °·   Certain foods and drinks contain compounds that impair iron absorption. Avoid eating these foods in the same meal as iron-rich foods or with iron supplements. These include: °¨ Coffee, black tea, and red wine. °¨ Milk, dairy products, and foods that are high in calcium. °¨ Beans, soybeans, and peas. °¨ Whole grains. °· When eating foods that contain both nonheme iron and compounds that impair iron absorption, follow these tips to absorb iron better.   °¨ Soak beans overnight before cooking. °¨ Soak whole grains overnight and drain them before using. °¨ Ferment flours before baking, such as using yeast in bread dough. °WHAT FOODS CAN I EAT? °Grains  °Iron-fortified breakfast cereal. Iron-fortified whole-wheat bread. Enriched rice. Sprouted grains. °Vegetables  °Spinach. Potatoes with skin. Green peas. Broccoli. Red and green bell peppers. Fermented vegetables. °Fruits  °Prunes. Raisins. Oranges. Strawberries. Mango.  Grapefruit. °Meats and Other Protein Sources  °Beef liver. Oysters. Beef. Shrimp. Turkey. Chicken. Tuna. Sardines. Chickpeas. Nuts. Tofu. °Beverages  °Tomato juice. Fresh orange juice. Prune juice. Hibiscus tea. Fortified instant breakfast shakes. °Condiments  °Tahini. Fermented soy sauce.  °Sweets and Desserts  °Black-strap molasses.  °Other  °Wheat germ. °The items listed above may not be a complete list of recommended foods or beverages. Contact your dietitian for more options.  °WHAT FOODS ARE NOT RECOMMENDED? °Grains  °Whole grains. Bran cereal. Bran flour. Oats. °Vegetables  °Artichokes. Brussels sprouts. Kale. °Fruits  °Blueberries. Raspberries. Strawberries. Figs. °Meats and Other Protein Sources  °Soybeans. Products made from soy protein. °Dairy  °Milk. Cream. Cheese. Yogurt. Cottage cheese. °Beverages  °Coffee. Black tea. Red wine. °Sweets and Desserts  °Cocoa. Chocolate. Ice cream. °Other  °Basil. Oregano. Parsley. °The items listed above may not be a complete list of foods and beverages to avoid. Contact your dietitian for more information.  °  °This information is not intended to replace advice given to you by your health care provider. Make sure you discuss any questions you have with your health care provider. °  °Document Released: 12/30/2004 Document Revised: 06/08/2014 Document Reviewed: 12/13/2013 °Elsevier Interactive Patient Education ©2016 [employer]. ° °

## 2015-04-17 ENCOUNTER — Ambulatory Visit (HOSPITAL_COMMUNITY)
Admission: RE | Admit: 2015-04-17 | Discharge: 2015-04-17 | Disposition: A | Discharge status: Home / Self Care | Payer: BLUE CROSS/BLUE SHIELD | Source: Ambulatory Visit | Attending: Obstetrics and Gynecology | Admitting: Obstetrics and Gynecology

## 2015-04-17 NOTE — Lactation Note (Addendum)
Lactation Consult; Experienced BF mom reports very sore nipples - right one is getting better and she has been nursing on that breast. Reports left one is still very sore and she does not want to latch baby to that breast today.Right nipple with crack noted on underside of nipple on outer edge at base of nipple. Right breast looks like healing crack in center of nipple. Has been pumping left breast q feeding. Baby has been nursing for 15- 20 min and then going off to sleep. Baby is still not up to birth weight at 5115 days old. [facility] [employer] saw her yesterday and suggested feeding back all EBM to baby. Mom has been storing that milk because baby was going off to sleep.  Mom pumped 2 1/2 oz here after nursing and friend bottle fed it to baby. Baby very content after feeding. # 27 flanges given for mom to use .[facility]- she is on her way to pick that up. [facility] to come back Friday for another weight check. Encouraged to pump both breasts and feed all EBM back to baby, Suggested [facility] as resource for support and weight check. No further questions at present. To call prn  Mother's reason for visit:  To check latch because of very sore nipples Visit Type:  Feeding assessment Appointment Notes:  Experienced BF mom Consult:  Initial Lactation Consultant:  Erica Ramsey, Erica Ramsey  ________________________________________________________________________  Erica FloresBaby's Name: Erica FreestoneElizabeth Claire Ramsey Date of Birth: 04/02/2016  Gender: female Gestational Age: 6680w6d (At Birth) Birth Weight: 9 lb 15.6 oz (4525 g) Weight at Discharge: Weight: 9 lb 7.6 oz (4298 g)[facility]   [facility]15 2103 04/03/15 2315  Weight: 9 lb 15.6 oz (4525 g) 9 lb 7.6 oz (4298 g)       Weight today 4180 g  9 lb- 3.5 oz ________________________________________________________________________  Mother's Name: Erica Ramsey Type of delivery:   Breastfeeding Experience:   P5- breastfed all her babies last one for 2 1/2 years ________________________________________________________________________  Breastfeeding History (Post Discharge)  Frequency of breastfeeding:  q 2-3 hrs Duration of feeding:  15-20 min  Supplementation  Formula:  Volume 0 ml  Breastmilk:  Volume 1-2 oz since yesterday   Method:  Bottle,   Pumping  Type of pump:  Medela pump in style Frequency:  q feeding pumping left breast Volume: 1-2 oz  Infant Intake and Output Assessment  Voids:  8+ in 24 hrs.  Color:  Clear yellow  Mom reports since baby has been supplementing she has noticed that wet diapers are much heavier Stools:  8+ in 24 hrs.  Color:  Yellow  ________________________________________________________________________  Maternal Breast Assessment  Breast:  Filling Nipple:  Erect  _______________________________________________________________________ Feeding Assessment/Evaluation  Initial feeding assessment:    Positioning:  Football Right breast  LATCH documentation:  Latch:  2 = Grasps breast easily, tongue down, lips flanged, rhythmical sucking.  Audible swallowing:  2 = Spontaneous and intermittent  Type of nipple:  2 = Everted at rest and after stimulation  Comfort (Breast/Nipple):  1 = Filling, red/small blisters or bruises, mild/mod discomfort  Hold (Positioning):  2 = No assistance needed to correctly position infant at breast  LATCH score:  9  Attached assessment:  Deep  Lips flanged:  Yes.    Lips untucked:  No.  Suck assessment:  Nutritive   Pre-feed weight:  4180 g  (9 lb. 3.5 oz.) Post-feed weight:  4210 g (9 lb. 4.5  oz.) Amount transferred:  30 ml Amount supplemented:2 1/2 oz    Total amount pumped post feed:  R 1.5oz    L 1 oz Total amount transferred:  30 ml Total supplement given:  2.5 oz

## 2020-01-12 ENCOUNTER — Other Ambulatory Visit: Payer: Self-pay

## 2020-01-12 ENCOUNTER — Ambulatory Visit (HOSPITAL_COMMUNITY)
Admission: RE | Admit: 2020-01-12 | Discharge: 2020-01-12 | Disposition: A | Discharge status: Home / Self Care | Payer: BC Managed Care – PPO | Source: Ambulatory Visit | Attending: Adult Health Nurse Practitioner | Admitting: Adult Health Nurse Practitioner

## 2020-01-12 ENCOUNTER — Other Ambulatory Visit (HOSPITAL_COMMUNITY): Payer: Self-pay | Admitting: Adult Health Nurse Practitioner

## 2020-01-12 DIAGNOSIS — J189 Pneumonia, unspecified organism: Secondary | ICD-10-CM

## 2020-04-24 ENCOUNTER — Other Ambulatory Visit (HOSPITAL_COMMUNITY): Payer: Self-pay | Admitting: Internal Medicine

## 2020-04-24 DIAGNOSIS — Z1231 Encounter for screening mammogram for malignant neoplasm of breast: Secondary | ICD-10-CM

## 2022-01-28 ENCOUNTER — Other Ambulatory Visit: Payer: Self-pay

## 2022-01-28 ENCOUNTER — Emergency Department (HOSPITAL_COMMUNITY): Payer: BC Managed Care – PPO

## 2022-01-28 ENCOUNTER — Encounter (HOSPITAL_COMMUNITY): Payer: Self-pay | Admitting: Emergency Medicine

## 2022-01-28 ENCOUNTER — Emergency Department (HOSPITAL_COMMUNITY)
Admission: EM | Admit: 2022-01-28 | Discharge: 2022-01-28 | Disposition: A | Discharge status: Home / Self Care | Payer: BC Managed Care – PPO | Attending: Emergency Medicine | Admitting: Emergency Medicine

## 2022-01-28 DIAGNOSIS — R109 Unspecified abdominal pain: Secondary | ICD-10-CM | POA: Diagnosis present

## 2022-01-28 DIAGNOSIS — N200 Calculus of kidney: Secondary | ICD-10-CM | POA: Diagnosis not present

## 2022-01-28 LAB — COMPREHENSIVE METABOLIC PANEL
ALT: 35 U/L (ref 0–44)
AST: 22 U/L (ref 15–41)
Albumin: 4 g/dL (ref 3.5–5.0)
Alkaline Phosphatase: 43 U/L (ref 38–126)
Anion gap: 14 (ref 5–15)
BUN: 19 mg/dL (ref 6–20)
CO2: 24 mmol/L (ref 22–32)
Calcium: 9.6 mg/dL (ref 8.9–10.3)
Chloride: 101 mmol/L (ref 98–111)
Creatinine, Ser: 0.89 mg/dL (ref 0.44–1.00)
GFR, Estimated: >60 mL/min (ref >60)
Glucose, Bld: 158 mg/dL — ABNORMAL HIGH (ref 70–99)
Potassium: 3.4 mmol/L — ABNORMAL LOW (ref 3.5–5.1)
Sodium: 139 mmol/L (ref 135–145)
Total Bilirubin: 0.6 mg/dL (ref 0.3–1.2)
Total Protein: 6.3 g/dL — ABNORMAL LOW (ref 6.5–8.1)

## 2022-01-28 LAB — CBC WITH DIFFERENTIAL/PLATELET
Abs Immature Granulocytes: 0.01 10*3/uL (ref 0.00–0.07)
Basophils Absolute: 0 10*3/uL (ref 0.0–0.1)
Basophils Relative: 0 %
Eosinophils Absolute: 0 10*3/uL (ref 0.0–0.5)
Eosinophils Relative: 1 %
HCT: 37.8 % (ref 36.0–46.0)
Hemoglobin: 13.4 g/dL (ref 12.0–15.0)
Immature Granulocytes: 0 %
Lymphocytes Relative: 15 %
Lymphs Abs: 0.5 10*3/uL — ABNORMAL LOW (ref 0.7–4.0)
MCH: 30.7 pg (ref 26.0–34.0)
MCHC: 35.4 g/dL (ref 30.0–36.0)
MCV: 86.5 fL (ref 80.0–100.0)
Monocytes Absolute: 0.1 10*3/uL (ref 0.1–1.0)
Monocytes Relative: 4 %
Neutro Abs: 2.8 10*3/uL (ref 1.7–7.7)
Neutrophils Relative %: 80 %
Platelets: 159 10*3/uL (ref 150–400)
RBC: 4.37 MIL/uL (ref 3.87–5.11)
RDW: 11.9 % (ref 11.5–15.5)
WBC: 3.4 10*3/uL — ABNORMAL LOW (ref 4.0–10.5)
nRBC: 0 % (ref 0.0–0.2)

## 2022-01-28 LAB — I-STAT BETA HCG BLOOD, ED (MC, WL, AP ONLY): I-stat hCG, quantitative: 6.2 m[IU]/mL — ABNORMAL HIGH (ref <5)

## 2022-01-28 LAB — URINALYSIS, ROUTINE W REFLEX MICROSCOPIC
Bacteria, UA: NONE SEEN
Bilirubin Urine: NEGATIVE
Glucose, UA: NEGATIVE mg/dL
Ketones, ur: 5 mg/dL — AB
Leukocytes,Ua: NEGATIVE
Nitrite: NEGATIVE
Protein, ur: NEGATIVE mg/dL
RBC / HPF: >50 RBC/hpf — ABNORMAL HIGH (ref 0–5)
Specific Gravity, Urine: 1.016 (ref 1.005–1.030)
pH: 5 (ref 5.0–8.0)

## 2022-01-28 LAB — LIPASE, BLOOD: Lipase: 32 U/L (ref 11–51)

## 2022-01-28 LAB — PREGNANCY, URINE: Preg Test, Ur: NEGATIVE

## 2022-01-28 MED ORDER — SODIUM CHLORIDE 0.9 % IV BOLUS
1000.0000 mL | Freq: Once | INTRAVENOUS | Status: AC
  Administered 2022-01-28: 1000 mL via INTRAVENOUS

## 2022-01-28 MED ORDER — HYDROMORPHONE HCL 1 MG/ML IJ SOLN
1.0000 mg | Freq: Once | INTRAMUSCULAR | Status: AC
  Administered 2022-01-28: 1 mg via INTRAVENOUS
  Filled 2022-01-28: qty 1

## 2022-01-28 MED ORDER — OXYCODONE-ACETAMINOPHEN 5-325 MG PO TABS
1.0000 | ORAL_TABLET | Freq: Once | ORAL | Status: AC
  Administered 2022-01-28: 1 via ORAL
  Filled 2022-01-28: qty 1

## 2022-01-28 MED ORDER — IBUPROFEN 800 MG PO TABS: 800.0000 mg | ORAL_TABLET | Freq: Three times a day (TID) | ORAL | 0 refills | Status: AC

## 2022-01-28 MED ORDER — ONDANSETRON HCL 4 MG/2ML IJ SOLN
4.0000 mg | Freq: Once | INTRAMUSCULAR | Status: AC | PRN
  Administered 2022-01-28: 4 mg via INTRAVENOUS
  Filled 2022-01-28: qty 2

## 2022-01-28 MED ORDER — KETOROLAC TROMETHAMINE 15 MG/ML IJ SOLN
15.0000 mg | Freq: Once | INTRAMUSCULAR | Status: AC
Start: 2022-01-28 — End: 2022-01-28
  Administered 2022-01-28: 15 mg via INTRAVENOUS
  Filled 2022-01-28: qty 1

## 2022-01-28 MED ORDER — OXYCODONE-ACETAMINOPHEN 5-325 MG PO TABS: 1.0000 | ORAL_TABLET | Freq: Four times a day (QID) | ORAL | 0 refills | Status: AC | PRN

## 2022-01-28 MED ORDER — ONDANSETRON 4 MG PO TBDP: 4.0000 mg | ORAL_TABLET | Freq: Three times a day (TID) | ORAL | 0 refills | Status: AC | PRN

## 2022-01-28 MED ORDER — HYDROMORPHONE HCL 1 MG/ML IJ SOLN
0.5000 mg | Freq: Once | INTRAMUSCULAR | Status: AC
  Administered 2022-01-28: 0.5 mg via INTRAVENOUS
  Filled 2022-01-28: qty 1

## 2022-01-28 MED ORDER — ONDANSETRON 4 MG PO TBDP
4.0000 mg | ORAL_TABLET | Freq: Once | ORAL | Status: AC
  Administered 2022-01-28: 4 mg via ORAL
  Filled 2022-01-28: qty 1

## 2022-01-28 MED ORDER — ONDANSETRON HCL 4 MG/2ML IJ SOLN
INTRAMUSCULAR | Status: AC
  Filled 2022-01-28: qty 2

## 2022-01-28 NOTE — Discharge Instructions (Addendum)
You were seen in the emergency department and found to have a kidney stone that is 32mm on the left side. Your labs showed mildly low potassium- please include potassium rich foods in your diet.  We are sending you home with multiple medications to assist with passing the stone:   -Ibuprofen 800 mg-this is a medication that will help with pain as well as passing the stone.  Please take this every 8 hours.  Take this with food as it can cause stomach upset and at worst stomach bleeding.  Do not take other NSAIDs such as Motrin, Aleve, Advil, Mobic, or Naproxen with this medicine as they are similar and would propagate any potential side effects.   -Percocet-this is a narcotic/controlled substance medication that has potential addicting qualities.  We recommend that you take 1-2 tablets every 6 hours as needed for severe pain.  Do not drive or operate heavy machinery when taking this medicine as it can be sedating. Do not drink alcohol or take other sedating medications when taking this medicine for safety reasons.  Keep this out of reach of small children.  Please be aware this medicine has Tylenol in it (325 mg/tab) do not exceed the maximum dose of Tylenol in a day per over the counter recommendations should you decide to supplement with Tylenol over the counter.   -Zofran-this is an antinausea medication, you may take this every 8 hours as needed for nausea and vomiting, please allow the tablet to dissolve underneath of your tongue.   We have prescribed you new medication(s) today. Discuss the medications prescribed today with your pharmacist as they can have adverse effects and interactions with your other medicines including over the counter and prescribed medications. Seek medical evaluation if you start to experience new or abnormal symptoms after taking one of these medicines, seek care immediately if you start to experience difficulty breathing, feeling of your throat closing, facial swelling, or rash  as these could be indications of a more serious allergic reaction  Please follow-up with the urology group provided in your discharge instructions within 3 to 5 days.  Return to the ER for new or worsening symptoms including but not limited to worsening pain not controlled by these medicines, inability to keep fluids down, fever, or any other concerns that you may have.

## 2022-01-28 NOTE — ED Provider Notes (Signed)
[facility] EMERGENCY DEPARTMENT Provider Note   CSN: [id number] Arrival date & time: 01/28/22  9702     History  Chief Complaint  Patient presents with   Abdominal Pain    Erica Ramsey is a 46 y.o. female.who presents to the ED with complaints of abdominal pain that began @ 00:30 while patient was in bed. Pain is located in the left side/abdomen, waxing/waning, worse with movement, alleviated some with tylenol prior to arrival. Associated nausea. Denies fever, emesis, diarrhea, or hematuria  HPI     Home Medications Prior to Admission medications   Medication Sig Start Date End Date Taking? Authorizing Provider  OVER THE COUNTER MEDICATION Take 5 mLs by mouth 2 (two) times daily. Bovine Colostrum: Mix 1 teaspoon with beverage and drink twice a day   Yes [provider]      Allergies    Doxycycline, Erythromycin, and Shellfish allergy    Review of Systems   Review of Systems  Constitutional:  Negative for chills and fever.  Respiratory:  Negative for shortness of breath.   Cardiovascular:  Negative for chest pain.  Gastrointestinal:  Positive for abdominal pain and nausea. Negative for diarrhea and vomiting.  Genitourinary:  Negative for dysuria, vaginal bleeding and vaginal discharge.  All other systems reviewed and are negative.   Physical Exam Updated Vital Signs BP 118/69   Pulse 94   Temp 97.7 F (36.5 C) (Oral)   Resp 18   Ht 5\' 4"  (1.626 m)   Wt 64 kg   SpO2 100%   BMI 24.22 kg/m  Physical Exam Vitals and nursing note reviewed.  Constitutional:      Appearance: She is well-developed. She is not toxic-appearing.  HENT:     Head: Normocephalic and atraumatic.  Eyes:     General:        Right eye: No discharge.        Left eye: No discharge.     Conjunctiva/sclera: Conjunctivae normal.  Cardiovascular:     Rate and Rhythm: Normal rate and regular rhythm.  Pulmonary:     Effort: No respiratory distress.     Breath  sounds: Normal breath sounds. No wheezing or rales.  Abdominal:     General: There is no distension.     Palpations: Abdomen is soft.     Tenderness: There is abdominal tenderness (left mid abdomen). There is no right CVA tenderness, left CVA tenderness, guarding or rebound.  Musculoskeletal:     Cervical back: Neck supple.  Skin:    General: Skin is warm and dry.  Neurological:     Mental Status: She is alert.     Comments: Clear speech.   Psychiatric:        Behavior: Behavior normal.     ED Results / Procedures / Treatments   Labs (all labs ordered are listed, but only abnormal results are displayed) Labs Reviewed  COMPREHENSIVE METABOLIC PANEL - Abnormal; Notable for the following components:      Result Value   Potassium 3.4 (*)    Glucose, Bld 158 (*)    Total Protein 6.3 (*)    All other components within normal limits  CBC WITH DIFFERENTIAL/PLATELET - Abnormal; Notable for the following components:   WBC 3.4 (*)    Lymphs Abs 0.5 (*)    All other components within normal limits  URINALYSIS, ROUTINE W REFLEX MICROSCOPIC - Abnormal; Notable for the following components:   Hgb urine dipstick LARGE (*)  Ketones, ur 5 (*)    RBC / HPF >50 (*)    All other components within normal limits  I-STAT BETA HCG BLOOD, ED ([facility], [facility], [facility] ONLY) - Abnormal; Notable for the following components:   I-stat hCG, quantitative 6.2 (*)    All other components within normal limits  LIPASE, BLOOD  PREGNANCY, URINE    EKG None  Radiology CT Renal Stone Study  Result Date: 01/28/2022 CLINICAL DATA:  Flank pain, kidney stone suspected, left-sided. EXAM: CT ABDOMEN AND PELVIS WITHOUT CONTRAST TECHNIQUE: Multidetector CT imaging of the abdomen and pelvis was performed following the standard protocol without IV contrast. RADIATION DOSE REDUCTION: This exam was performed according to the departmental dose-optimization program which includes automated exposure control, adjustment of the mA  and/or kV according to patient size and/or use of iterative reconstruction technique. COMPARISON:  None Available. FINDINGS: Lower chest: No acute abnormality. Hepatobiliary: No focal liver abnormality is seen. No gallstones, gallbladder wall thickening, or biliary dilatation. Pancreas: Unremarkable. No pancreatic ductal dilatation or surrounding inflammatory changes. Spleen: Normal in size without focal abnormality. Adrenals/Urinary Tract: The adrenal glands are within normal limits. Punctate renal calculi are noted bilaterally. No hydroureteronephrosis on the right. There is mild-to-moderate obstructive uropathy on the left with a 2 mm calculus at the UVJ on the left. The urinary bladder is nondistended. Stomach/Bowel: Stomach is within normal limits. Appendix appears normal. No evidence of bowel wall thickening, distention, or inflammatory changes. Moderate amount of retained stool in the colon. Vascular/Lymphatic: No significant vascular findings are present. No enlarged abdominal or pelvic lymph nodes. Reproductive: Uterus and bilateral adnexa are unremarkable. Other: Small fat containing umbilical hernia. No abdominopelvic ascites. Musculoskeletal: No acute osseous abnormality. IMPRESSION: 1. Mild to moderate obstructive uropathy on the left with a 2 mm calculus in the distal left ureter at the UVJ. 2. Bilateral nephrolithiasis. Electronically Signed   By: Thornell Sartorius M.D.   On: 01/28/2022 04:19    Procedures Procedures    Medications Ordered in ED Medications  ondansetron (ZOFRAN) injection 4 mg (has no administration in time range)  sodium chloride 0.9 % bolus 1,000 mL (1,000 mLs Intravenous New Bag/Given 01/28/22 0528)  ketorolac (TORADOL) 15 MG/ML injection 15 mg (15 mg Intravenous Given 01/28/22 0528)    ED Course/ Medical Decision Making/ A&P                           Medical Decision Making Amount and/or Complexity of Data Reviewed Labs: ordered. Radiology:  ordered.  Risk Prescription drug management.  Patient presents to the ED with complaints of left sided abdominal pain, this involves an extensive number of treatment options, and is a complaint that carries with it a high risk of complications and morbidity..   Ddx including but not limited to: kidney stone, pyelonephritis, diverticulitis, perf, obstruction, pancreatitis, MSK pain.   Additional history obtained:  Chart/nursing notes reviewed.   Lab Tests:  I viewed & interpreted labs including:  CBC: mild leukopenia- hx of same in the apst CMP: mild hypokalemia, no critical electrolyte derangement. Renal function WNL Lipase: WNL Preg test: Negative UA: hematuria, no UTI.   Imaging Studies:  I ordered and viewed the following imaging, agree with radiologist impression:  CT renal stone study: 1. Mild to moderate obstructive uropathy on the left with a 2 mm calculus in the distal left ureter at the UVJ. 2. Bilateral nephrolithiasis  ED Course:  2 mm UVJ stone- no UTI, renal function  preserved. Patient ideally would like to avoid medications as much as possible, agreeable to toradol & fluids, would like to hold off on narcotics.   06:10: RE-EVAL: Patient remains extremely uncomfortable- now agreeable to narcotics, discussed options, will proceed with IV Dilaudid.   07:00 RE-EVAL: Pain improved some, but remains moderate to severely uncomfortable, additional dose of dilaudid ordered.   07:10: Patient care signed out to AM ED team PA [facility] @ shift change pending re-assessment & disposition.   Portions of this note were generated with Scientist, clinical (histocompatibility and immunogenetics). Dictation errors may occur despite best attempts at proofreading.   Final Clinical Impression(s) / ED Diagnoses Final diagnoses:  Kidney stone    Rx / DC Orders ED Discharge Orders     None      [facility] Controlled Substance reporting System queried    Desmond Lope 01/28/22 3419    Sabas Sous, MD 01/28/22 [phone]

## 2022-01-28 NOTE — ED Provider Triage Note (Signed)
Emergency Medicine Provider Triage Evaluation Note  Erica Ramsey , a 46 y.o. female  was evaluated in triage.  Pt complains of abdominal pain that began @ 00:30 while patient was in bed. Pain is located in the left side/abdomen, waxing/waning, worse with movement, alleviated some with tylenol prior to arrival. Associated nausea. Denies fever, emesis, diarrhea, or hematuria..  Review of Systems  Per above.   Physical Exam  BP (!) 113/90 (BP Location: Right Arm)   Pulse 91   Temp 97.7 F (36.5 C) (Oral)   Resp 18   Ht 5\' 4"  (1.626 m)   Wt 64 kg   SpO2 100%   BMI 24.22 kg/m  Gen:   Awake, alert Resp:  Normal effort  MSK:   Moves extremities without difficulty  Other:  Left mid abdomen pain without peritoneal signs.   Medical Decision Making  Medically screening exam initiated at 2:59 AM.  Appropriate orders placed.  HUNTLEIGH DOOLEN was informed that the remainder of the evaluation will be completed by another provider, this initial triage assessment does not replace that evaluation, and the importance of remaining in the ED until their evaluation is complete.  Abdominal pain.  Offered analgesics, patient would like to hold off as she does not like taking medications much, plan for labs & CT renal stone study.    Anselmo Rod, Cherly Anderson 01/28/22 0302

## 2022-01-28 NOTE — ED Provider Notes (Signed)
Accepted handoff at shift change from [facility]. Please see prior provider note for more detail.   Briefly: Patient is 46 y.o. with new 2 mm UVJ stone and preserved renal function  DDX: concern for renal stone, pyelo, obstruction, diverticulitis  Plan: Pain control, reassess and PO challenge      RISR  EDTHIS     Physical Exam  BP 130/76   Pulse 96   Temp 97.7 F (36.5 C) (Oral)   Resp 16   Ht 5\' 4"  (1.626 m)   Wt 64 kg   SpO2 100%   BMI 24.22 kg/m   Physical Exam  Procedures  Procedures  Results for orders placed or performed during the hospital encounter of 01/28/22  Comprehensive metabolic panel  Result Value Ref Range   Sodium 139 135 - 145 mmol/L   Potassium 3.4 (L) 3.5 - 5.1 mmol/L   Chloride 101 98 - 111 mmol/L   CO2 24 22 - 32 mmol/L   Glucose, Bld 158 (H) 70 - 99 mg/dL   BUN 19 6 - 20 mg/dL   Creatinine, Ser 01/30/22 0.44 - 1.00 mg/dL   Calcium 9.6 8.9 - 2.95 mg/dL   Total Protein 6.3 (L) 6.5 - 8.1 g/dL   Albumin 4.0 3.5 - 5.0 g/dL   AST 22 15 - 41 U/L   ALT 35 0 - 44 U/L   Alkaline Phosphatase 43 38 - 126 U/L   Total Bilirubin 0.6 0.3 - 1.2 mg/dL   GFR, Estimated 18.8 >41 mL/min   Anion gap 14 5 - 15  CBC with Differential  Result Value Ref Range   WBC 3.4 (L) 4.0 - 10.5 K/uL   RBC 4.37 3.87 - 5.11 MIL/uL   Hemoglobin 13.4 12.0 - 15.0 g/dL   HCT >66 06.3 - 01.6 %   MCV 86.5 80.0 - 100.0 fL   MCH 30.7 26.0 - 34.0 pg   MCHC 35.4 30.0 - 36.0 g/dL   RDW 01.0 93.2 - 35.5 %   Platelets 159 150 - 400 K/uL   nRBC 0.0 0.0 - 0.2 %   Neutrophils Relative % 80 %   Neutro Abs 2.8 1.7 - 7.7 K/uL   Lymphocytes Relative 15 %   Lymphs Abs 0.5 (L) 0.7 - 4.0 K/uL   Monocytes Relative 4 %   Monocytes Absolute 0.1 0.1 - 1.0 K/uL   Eosinophils Relative 1 %   Eosinophils Absolute 0.0 0.0 - 0.5 K/uL   Basophils Relative 0 %   Basophils Absolute 0.0 0.0 - 0.1 K/uL   Immature Granulocytes 0 %   Abs Immature Granulocytes 0.01 0.00 - 0.07 K/uL   Lipase, blood  Result Value Ref Range   Lipase 32 11 - 51 U/L  Urinalysis, Routine w reflex microscopic Urine, Clean Catch  Result Value Ref Range   Color, Urine YELLOW YELLOW   APPearance CLEAR CLEAR   Specific Gravity, Urine 1.016 1.005 - 1.030   pH 5.0 5.0 - 8.0   Glucose, UA NEGATIVE NEGATIVE mg/dL   Hgb urine dipstick LARGE (A) NEGATIVE   Bilirubin Urine NEGATIVE NEGATIVE   Ketones, ur 5 (A) NEGATIVE mg/dL   Protein, ur NEGATIVE NEGATIVE mg/dL   Nitrite NEGATIVE NEGATIVE   Leukocytes,Ua NEGATIVE NEGATIVE   RBC / HPF >50 (H) 0 - 5 RBC/hpf   WBC, UA 0-5 0 - 5 WBC/hpf   Bacteria, UA NONE SEEN NONE SEEN   Squamous Epithelial / LPF 0-5 0 - 5   Mucus PRESENT  Pregnancy, urine  Result Value Ref Range   Preg Test, Ur NEGATIVE NEGATIVE  I-Stat beta hCG blood, ED  Result Value Ref Range   I-stat hCG, quantitative 6.2 (H) <5 mIU/mL   Comment 3           CT Renal Stone Study  Result Date: 01/28/2022 CLINICAL DATA:  Flank pain, kidney stone suspected, left-sided. EXAM: CT ABDOMEN AND PELVIS WITHOUT CONTRAST TECHNIQUE: Multidetector CT imaging of the abdomen and pelvis was performed following the standard protocol without IV contrast. RADIATION DOSE REDUCTION: This exam was performed according to the departmental dose-optimization program which includes automated exposure control, adjustment of the mA and/or kV according to patient size and/or use of iterative reconstruction technique. COMPARISON:  None Available. FINDINGS: Lower chest: No acute abnormality. Hepatobiliary: No focal liver abnormality is seen. No gallstones, gallbladder wall thickening, or biliary dilatation. Pancreas: Unremarkable. No pancreatic ductal dilatation or surrounding inflammatory changes. Spleen: Normal in size without focal abnormality. Adrenals/Urinary Tract: The adrenal glands are within normal limits. Punctate renal calculi are noted bilaterally. No hydroureteronephrosis on the right. There is mild-to-moderate  obstructive uropathy on the left with a 2 mm calculus at the UVJ on the left. The urinary bladder is nondistended. Stomach/Bowel: Stomach is within normal limits. Appendix appears normal. No evidence of bowel wall thickening, distention, or inflammatory changes. Moderate amount of retained stool in the colon. Vascular/Lymphatic: No significant vascular findings are present. No enlarged abdominal or pelvic lymph nodes. Reproductive: Uterus and bilateral adnexa are unremarkable. Other: Small fat containing umbilical hernia. No abdominopelvic ascites. Musculoskeletal: No acute osseous abnormality. IMPRESSION: 1. Mild to moderate obstructive uropathy on the left with a 2 mm calculus in the distal left ureter at the UVJ. 2. Bilateral nephrolithiasis. Electronically Signed   By: Thornell Sartorius M.D.   On: 01/28/2022 04:19      ED Course / MDM    Medical Decision Making Amount and/or Complexity of Data Reviewed Labs: ordered. Radiology: ordered.  Risk Prescription drug management.   Patient with new kidney stone with preserved renal function. Ongoing pain control and plan for discharge after PO challenge.        Gareth Eagle, PA-C 01/28/22 0720    Virgina Norfolk, DO 01/28/22 [phone]

## 2022-01-28 NOTE — ED Notes (Addendum)
Fluid challenge with water at this time. Pt states she woke up with pain not too long ago. Explained to pt the plan to ambulate and PO challenge her. Provider notified. Will continue to monitor.

## 2022-01-28 NOTE — ED Notes (Signed)
Passes PO challenge. Ambulated in the room with assistance by husband as pt reports some dizziness from pain meds. Will notify provider.

## 2022-01-28 NOTE — ED Triage Notes (Signed)
Pt reports that around 0030 she rolled over to her left side and experienced lower left side abdominal pain. Pt had trouble walking from the pain.  She took some homeopathic remedies which did not relieve symptoms.  Pt did take 2 tylenol prior to leaving. Pt has nausea but believes it is from pain.

## 2022-07-18 IMAGING — DX DG CHEST 2V
2 series · 2 of 2 positions shown · non-contrast
Comparison: Chest radiograph 12/29/2019

CLINICAL DATA: Hx of recent pna. No complaints at this time.

EXAM:
CHEST - 2 VIEW

[chest pa]
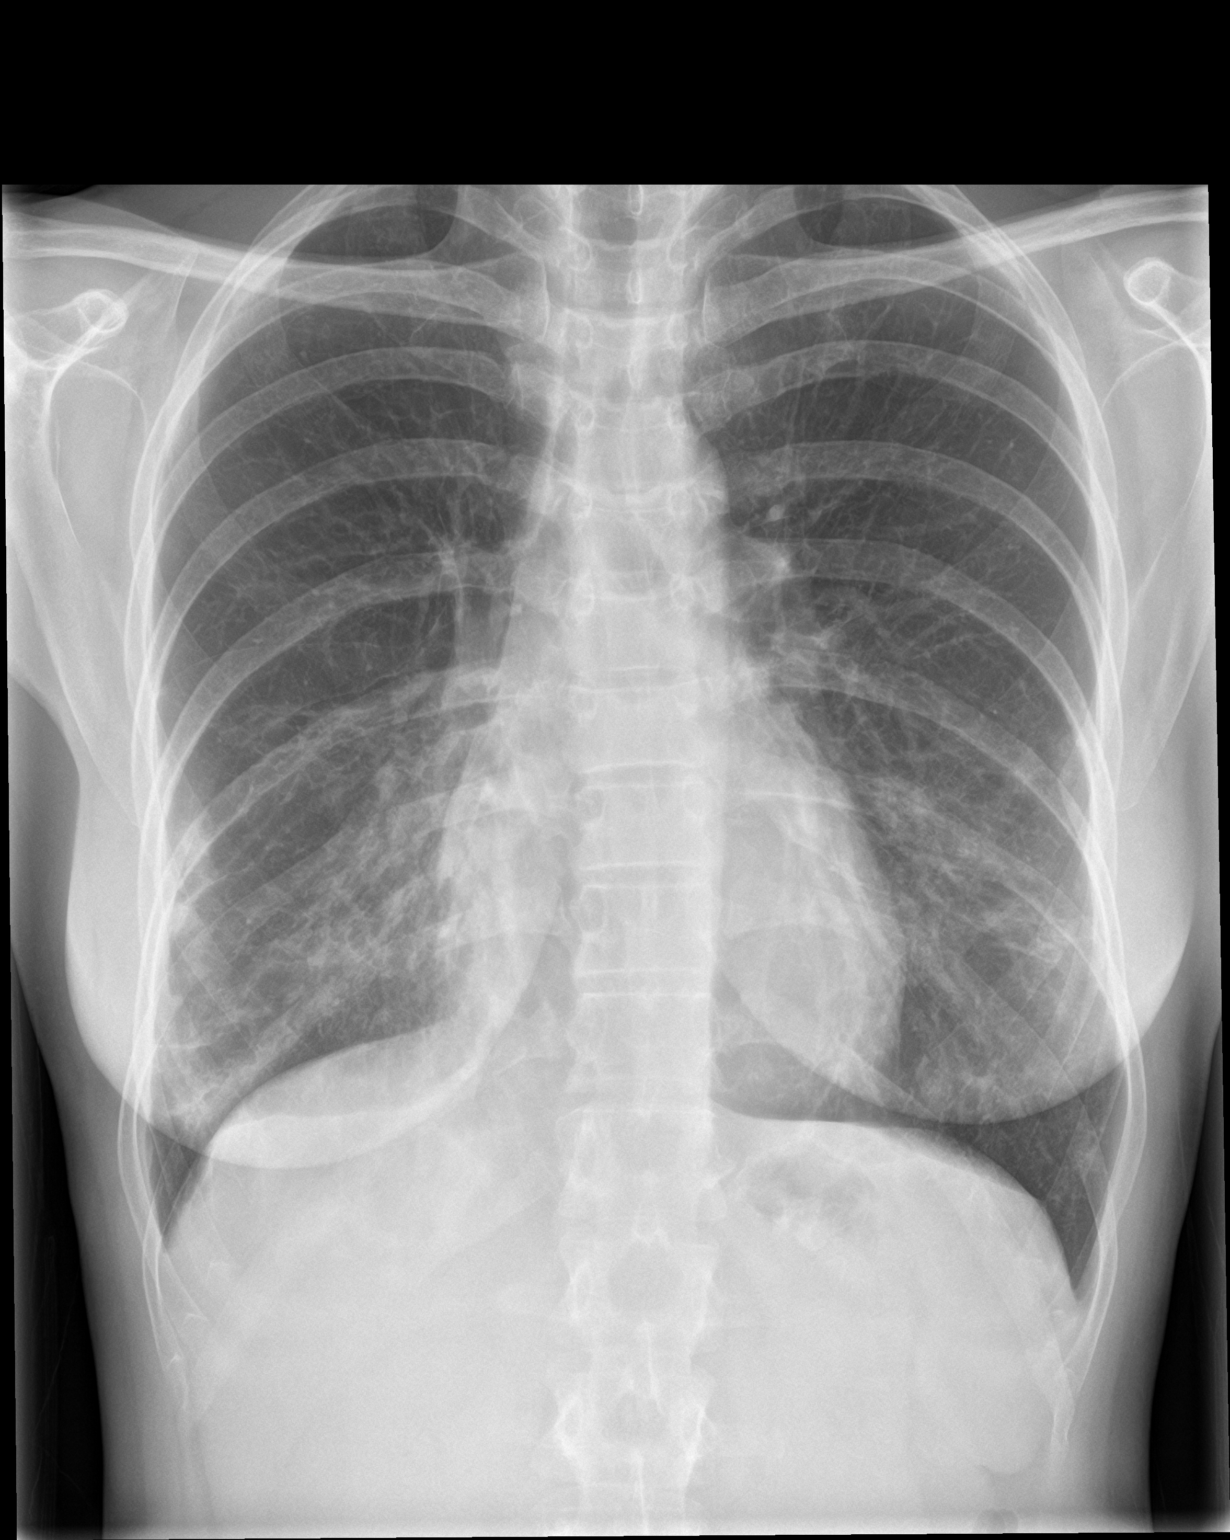

[chest lat]
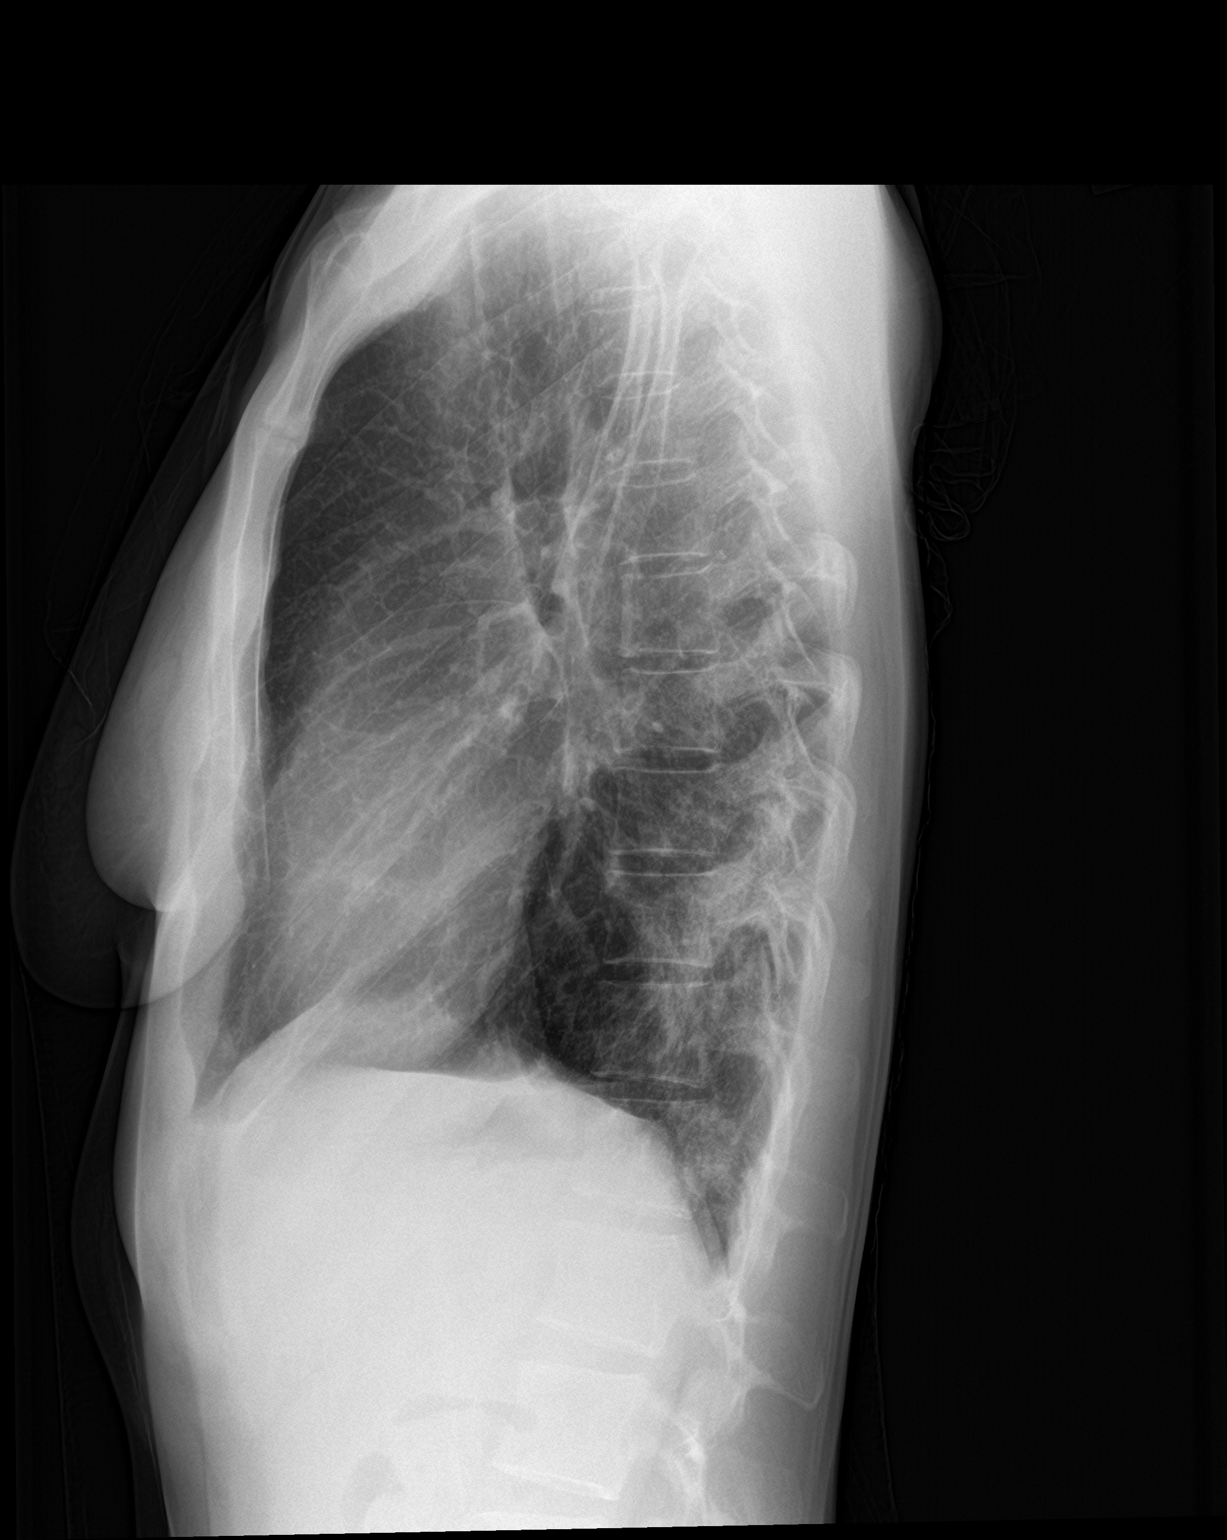

[2 of 2 positions shown; findings below may reference images not displayed]

FINDINGS: The heart size and mediastinal contours are within normal limits.
There are coarse interstitial opacities in the bilateral lower lungs
which could reflect resolving infection and/or evolving scarring. No
pneumothorax or pleural effusion. No acute finding in the visualized
skeleton.
IMPRESSION: Coarse interstitial opacities in the bilateral lower lungs possibly
resolving infection and/or evolving scarring. Recommend radiographic
follow-up in 3-4 weeks.
